# Patient Record
Sex: Male | Born: 1975 | Race: Black or African American | Hispanic: No | Marital: Single | Smoking: Current every day smoker
Health system: Southern US, Community
[De-identification: ages and names within clinical notes are randomized; demographics above are authoritative.]

## PROBLEM LIST (undated history)

## (undated) DIAGNOSIS — I10 Essential (primary) hypertension: Secondary | ICD-10-CM

## (undated) DIAGNOSIS — K279 Peptic ulcer, site unspecified, unspecified as acute or chronic, without hemorrhage or perforation: Secondary | ICD-10-CM

## (undated) HISTORY — PX: CORNEAL TRANSPLANT: SHX108

---

## 2005-01-05 ENCOUNTER — Inpatient Hospital Stay (HOSPITAL_COMMUNITY): Admission: EM | Admit: 2005-01-05 | Discharge: 2005-01-07 | Payer: Self-pay | Admitting: Emergency Medicine

## 2011-09-05 ENCOUNTER — Emergency Department (HOSPITAL_COMMUNITY)
Admission: EM | Admit: 2011-09-05 | Discharge: 2011-09-05 | Disposition: A | Discharge status: Home / Self Care | Payer: Self-pay | Attending: Emergency Medicine | Admitting: Emergency Medicine

## 2011-09-05 ENCOUNTER — Encounter (HOSPITAL_COMMUNITY): Payer: Self-pay | Admitting: *Deleted

## 2011-09-05 DIAGNOSIS — H5710 Ocular pain, unspecified eye: Secondary | ICD-10-CM | POA: Insufficient documentation

## 2011-09-05 DIAGNOSIS — F17200 Nicotine dependence, unspecified, uncomplicated: Secondary | ICD-10-CM | POA: Insufficient documentation

## 2011-09-05 MED ORDER — OXYCODONE-ACETAMINOPHEN 5-325 MG PO TABS
2.0000 | ORAL_TABLET | Freq: Once | ORAL | Status: AC
  Administered 2011-09-05: 2 via ORAL
  Filled 2011-09-05: qty 2

## 2011-09-05 MED ORDER — PROPARACAINE HCL 0.5 % OP SOLN
1.0000 [drp] | Freq: Once | OPHTHALMIC | Status: AC
  Administered 2011-09-05: 1 [drp] via OPHTHALMIC
  Filled 2011-09-05: qty 15

## 2011-09-05 MED ORDER — CIPROFLOXACIN HCL 0.3 % OP SOLN
1.0000 [drp] | Freq: Once | OPHTHALMIC | Status: AC
  Administered 2011-09-05: 1 [drp] via OPHTHALMIC
  Filled 2011-09-05: qty 2.5

## 2011-09-05 MED ORDER — FLUORESCEIN SODIUM 1 MG OP STRP
1.0000 | ORAL_STRIP | Freq: Once | OPHTHALMIC | Status: AC
  Administered 2011-09-05: 1 via OPHTHALMIC
  Filled 2011-09-05: qty 1

## 2011-09-05 MED ORDER — OXYCODONE-ACETAMINOPHEN 5-325 MG PO TABS: 1.0000 | ORAL_TABLET | ORAL | Status: AC | PRN

## 2011-09-05 NOTE — ED Notes (Signed)
The pt thinks he has an infection in his lt eye for 2 days.  He had a corneal transplant in 2010

## 2011-09-05 NOTE — ED Provider Notes (Signed)
History    36yM with L eye pain. Gradual onset 2d ago. Denies trauma. Initially thought had just had something in his eye, but persistently got worse. Photophobia. Tearing. Past hx significant for corneal transplant at [facility] in 2010. 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 It really didn't help my vision but they told me it probably wouldn't." Poor vision at baseline, but worse in L eye in past two days. No floaters or diplopia. Does not wear contacts. No welding or significant UV exposure.  No fever or chills.   CSN: [id number]  Arrival date & time 09/05/11  1501   First MD Initiated Contact with Patient 09/05/11 1516      Chief Complaint  Patient presents with  . Eye Pain    (Consider location/radiation/quality/duration/timing/severity/associated sxs/prior treatment) HPI  No past medical history on file.  Past Surgical History  Procedure Date  . Corneal transplant     No family history on file.  History  Substance Use Topics  . Smoking status: Current Everyday Smoker  . Smokeless tobacco: Not on file  . Alcohol Use: Yes      Review of Systems   Review of symptoms negative unless otherwise noted in HPI.   Allergies  Aspirin  Home Medications  No current outpatient prescriptions on file.  BP 140/90  Pulse 88  Temp 98.5 F (36.9 C) (Oral)  Resp 20  Physical Exam  Nursing note and vitals reviewed. Constitutional: He appears well-developed and well-nourished. No distress.  HENT:  Head: Normocephalic and atraumatic.  Eyes: Right eye exhibits no discharge. Left eye exhibits no discharge.       L conjunctiva injected. Tearing. Focal fluorescein uptake at ~ 12 o'clock position at junction of corneal transplant. Lids and lashed normal.   Neck: Neck supple.  Cardiovascular: Normal rate, regular rhythm and normal heart sounds.  Exam reveals no gallop and no friction rub.   No murmur heard. Pulmonary/Chest: Effort normal and breath sounds normal. No respiratory distress.    Abdominal: Soft.  Musculoskeletal: He exhibits no edema and no tenderness.  Neurological: He is alert.  Skin: Skin is warm and dry.  Psychiatric: He has a normal mood and affect. His behavior is normal. Thought content normal.    ED Course  Procedures (including critical care time)  Labs Reviewed - No data to display No results found.   1. Eye pain       MDM  36yM with atraumatic L eye pain. Focal uptake of fluorescein. Pt in need of very close optho fu. Abx and analgesia provided.         Raeford Razor, MD 09/08/11 2340

## 2011-09-09 ENCOUNTER — Encounter (HOSPITAL_COMMUNITY): Payer: Self-pay | Admitting: *Deleted

## 2011-09-09 ENCOUNTER — Emergency Department (HOSPITAL_COMMUNITY)
Admission: EM | Admit: 2011-09-09 | Discharge: 2011-09-10 | Disposition: A | Discharge status: Home / Self Care | Payer: Self-pay | Attending: Emergency Medicine | Admitting: Emergency Medicine

## 2011-09-09 DIAGNOSIS — H5710 Ocular pain, unspecified eye: Secondary | ICD-10-CM | POA: Insufficient documentation

## 2011-09-09 DIAGNOSIS — F17200 Nicotine dependence, unspecified, uncomplicated: Secondary | ICD-10-CM | POA: Insufficient documentation

## 2011-09-09 MED ORDER — OXYCODONE-ACETAMINOPHEN 5-325 MG PO TABS
2.0000 | ORAL_TABLET | Freq: Once | ORAL | Status: AC
  Administered 2011-09-09: 2 via ORAL
  Filled 2011-09-09: qty 2

## 2011-09-09 MED ORDER — PERCOCET 5-325 MG PO TABS: 1.0000 | ORAL_TABLET | ORAL | Status: AC | PRN

## 2011-09-09 MED ORDER — CIPROFLOXACIN HCL 0.3 % OP SOLN: 2.0000 [drp] | Freq: Four times a day (QID) | OPHTHALMIC | Status: AC

## 2011-09-09 MED ORDER — TETRACAINE HCL 0.5 % OP SOLN
2.0000 [drp] | Freq: Once | OPHTHALMIC | Status: AC
  Administered 2011-09-09: 2 [drp] via OPHTHALMIC
  Filled 2011-09-09: qty 2

## 2011-09-09 MED ORDER — CEPHALEXIN 500 MG PO CAPS: 500.0000 mg | ORAL_CAPSULE | Freq: Four times a day (QID) | ORAL | Status: AC

## 2011-09-09 NOTE — ED Provider Notes (Signed)
History     CSN: [id number]  Arrival date & time 09/09/11  1955   First MD Initiated Contact with Patient 09/09/11 2106      Chief Complaint  Patient presents with  . Eye Problem    (Consider location/radiation/quality/duration/timing/severity/associated sxs/prior treatment) HPI Comments: Patient with hx corneal transplant and current infection of same eye returns to ED today, last seen on 7/11, for continued left eye pain.  Pt states he was seen in ED, had testing done that he does not want done again, was using cipro drops and percocet at home with some improvement and wants a refill of these.  Has called ophthalmology but does not have appointment until 7 days from today.  Denies fevers.  Reports pain with moving eyes but no difficulty doing so.  Clear watery discharge. Denies trauma to the eye or any known foreign body.  Pain has been relieved with numbing drops in the ED and at home with percocet.  Per Dr Marylen Ponto notes patient has focal uptake of fluorescein on exam.  Pt states he does not know who his eye doctor is at [facility], states he has to go through "a bunch of papers" to find out.    Patient is a 36 y.o. male presenting with eye problem. The history is provided by the patient and medical records.  Eye Problem  Associated symptoms include discharge and eye redness.    History reviewed. No pertinent past medical history.  Past Surgical History  Procedure Date  . Corneal transplant     History reviewed. No pertinent family history.  History  Substance Use Topics  . Smoking status: Current Everyday Smoker  . Smokeless tobacco: Not on file  . Alcohol Use: Yes      Review of Systems  Constitutional: Negative for fever and chills.  Eyes: Positive for pain, discharge and redness. Negative for itching.    Allergies  Aspirin  Home Medications   Current Outpatient Rx  Name Route Sig Dispense Refill  . CIPROFLOXACIN HCL 0.3 % OP SOLN Left Eye Place 1 drop into the  left eye every 4 (four) hours.    . OXYCODONE-ACETAMINOPHEN 5-325 MG PO TABS Oral Take 1-2 tablets by mouth every 4 (four) hours as needed for pain. 15 tablet 0    BP 124/86  Pulse 66  Temp 97.2 F (36.2 C) (Oral)  Resp 18  SpO2 96%  Physical Exam  Constitutional: He is oriented to person, place, and time. He appears well-developed and well-nourished.  HENT:  Head: Normocephalic and atraumatic.  Eyes: EOM are normal. Left eye exhibits discharge. Left conjunctiva is injected. Left conjunctiva has no hemorrhage. No scleral icterus. Left eye exhibits normal extraocular motion and no nystagmus.  Neck: Neck supple.  Pulmonary/Chest: Effort normal.  Neurological: He is alert and oriented to person, place, and time. He exhibits normal muscle tone. Gait normal.    ED Course  Procedures (including critical care time)  Labs Reviewed - No data to display No results found.  11:47 PM Discussed patient with Dr Burgess Estelle who requests patient to call office at 8:30 tomorrow morning and will be seen the same day, should continue to antibiotics and pain medication.    Patient also seen and examined by Dr Jeraldine Loots who suggests adding oral antibiotic given preseptal edema.    1. Eye pain       MDM  Pt with hx corneal transplant at [facility] 2 years ago with 6 days of eye pain with injection, increased fluorescein  uptake at edge of transplant per Dr Marylen Ponto note 7/11.  Pt contacted ophthalmology but has delayed appointment.  Given patient's hx of corneal transplant I consulted the on call ophthalmologist for advise on treatment and to ensure closer follow up.  Dr Burgess Estelle agreed to see patient in his office tomorrow, will continue cipro and percocet, add keflex.  Return precautions given.  Patient verbalizes understanding and agrees with plan.          Dillard Cannon [place], [place] 09/10/11 [phone]

## 2011-09-09 NOTE — ED Notes (Signed)
Patient had a cornea transplant a few years ago and about two days ago he noticed that there was something wrong her his left.  Patient is having eye drainage and pain to the left eye

## 2011-09-10 NOTE — ED Notes (Signed)
Prescriptions given X3 with discharge instructions

## 2011-09-11 NOTE — ED Provider Notes (Signed)
Medical screening examination/treatment/procedure(s) were conducted as a shared visit with non-physician practitioner(s) and myself.  I personally evaluated the patient during the encounter On my exam this M w Hx of corneal transplant p/w concerns for infection.  He was in no distress.  We discussed the case with optho, and the patient was d/c w PO ABX and AM F/U.  Gerhard Munch, MD 09/11/11 [phone]

## 2012-06-04 ENCOUNTER — Emergency Department (INDEPENDENT_AMBULATORY_CARE_PROVIDER_SITE_OTHER)
Admission: EM | Admit: 2012-06-04 | Discharge: 2012-06-04 | Disposition: A | Discharge status: Home / Self Care | Payer: Self-pay | Source: Home / Self Care | Attending: Emergency Medicine | Admitting: Emergency Medicine

## 2012-06-04 ENCOUNTER — Encounter (HOSPITAL_COMMUNITY): Payer: Self-pay | Admitting: Emergency Medicine

## 2012-06-04 DIAGNOSIS — H5712 Ocular pain, left eye: Secondary | ICD-10-CM

## 2012-06-04 DIAGNOSIS — H5710 Ocular pain, unspecified eye: Secondary | ICD-10-CM

## 2012-06-04 HISTORY — DX: Essential (primary) hypertension: I10

## 2012-06-04 MED ORDER — CIPROFLOXACIN HCL 0.3 % OP SOLN: 1.0000 [drp] | OPHTHALMIC | Status: DC

## 2012-06-04 MED ORDER — HYDROCODONE-ACETAMINOPHEN 5-325 MG PO TABS: ORAL_TABLET | ORAL | Status: DC

## 2012-06-04 MED ORDER — HYDROCODONE-ACETAMINOPHEN 5-325 MG PO TABS
2.0000 | ORAL_TABLET | Freq: Once | ORAL | Status: AC
  Administered 2012-06-04: 2 via ORAL

## 2012-06-04 MED ORDER — HYDROCODONE-ACETAMINOPHEN 5-325 MG PO TABS
ORAL_TABLET | ORAL | Status: AC
  Filled 2012-06-04: qty 2

## 2012-06-04 MED ORDER — TETRACAINE HCL 0.5 % OP SOLN
OPHTHALMIC | Status: AC
  Filled 2012-06-04: qty 2

## 2012-06-04 NOTE — ED Notes (Signed)
Pt reports drainage out of both sides of left eye and is unable to see out of left eye and is burning and painful. Extremely sensitive to light and is having headaches. Pt had cornea transplant 2 1./2 years ago in left eye - did not get stiches removed until 6 months ago. Can only see shadows out of right (requires surgery but does not have finances at this time) referred to [facility]. Pt is seen at [facility] for regular eye tx.

## 2012-06-04 NOTE — ED Provider Notes (Signed)
Chief Complaint:   Chief Complaint  Patient presents with  . 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 He had a left corneal transplant 2-3 years ago at [facility]. There were some sutures that extruded about 6 months ago. He sees a Dr. Les Pou there. For the past 2 days he's had burning of the left eye, pain, tearing, and blurring of vision. He can't see much of his right eye at all. There is no purulent drainage, fever, nasal congestion, sore throat, or cough. The patient states he does not want to go back to [facility] for a variety of reasons. He cites transportation problems, caused, and the fact that they never do anything there but give him antibiotics and steroid drops when he presents with such complaints. He has had similar episodes to this in the past.  Review of Systems:  Other than noted above, the patient denies any of the following symptoms: Systemic:  No fever, chills, sweats, fatigue, or weight loss. Eye:  No redness, eye pain, photophobia, discharge, blurred vision, or diplopia. ENT:  No nasal congestion, rhinorrhea, or sore throat. Lymphatic:  No adenopathy. Skin:  No rash or pruritis.  PMFSH:  Past medical history, family history, social history, meds, and allergies were reviewed.   Physical Exam:   Vital signs:  BP 149/99  Pulse 111  Temp(Src) 98.5 F (36.9 C) (Oral)  Resp 18  SpO2 99% General:  Alert and in moderate distress due to his eye problem. Eye:  The cornea of the right eye is almost completely opaque and the pupil and fundus could not be seen. There was tearing and conjunctival injection of both eyes. The left eye has a corneal transplant. It appears to be normal. There is no purulent drainage. PERRLA, full EOMs, fundi were benign. ENT:  TMs and canals clear.  Nasal mucosa normal.  No intra-oral lesions, mucous membranes moist, pharynx clear. Neck:  No adenopathy tenderness or mass. Skin:  Clear,  warm and dry.  Assessment:  The encounter diagnosis was Eye pain, left.  I'm not sure was causing the eye pain. He needs to see an ophthalmologist. He did not want to go to [facility] to be seen there. Our ophthalmologist on call today was called and he agreed to see him in his office.  Plan:   1.  The following meds were prescribed:   Discharge Medication List as of 06/04/2012 11:53 AM    START taking these medications   Details  !! ciprofloxacin (CILOXAN) 0.3 % ophthalmic solution Place 1 drop into the left eye every 2 (two) hours. Administer 1 drop, every 2 hours, while awake, for 2 days. Then 1 drop, every 4 hours, while awake, for the next 5 days., Starting 06/04/2012, Until Discontinued, Normal    HYDROcodone-acetaminophen (NORCO/VICODIN) 5-325 MG per tablet 1 to 2 tabs every 4 to 6 hours as needed for pain., Print     !! - Potential duplicate medications found. Please discuss with provider.     2.  The patient was instructed in symptomatic care and handouts were given. 3.  The patient was told to return if becoming worse in any way, if no better in 3 or 4 days, and given some red flag symptoms such as changes in his vision, worsening pain, or fever that would indicate earlier return.     Reuben Likes, MD 06/04/12 2200

## 2013-04-09 ENCOUNTER — Emergency Department (HOSPITAL_COMMUNITY)
Admission: EM | Admit: 2013-04-09 | Discharge: 2013-04-09 | Disposition: A | Discharge status: Home / Self Care | Payer: Self-pay | Attending: Emergency Medicine | Admitting: Emergency Medicine

## 2013-04-09 ENCOUNTER — Encounter (HOSPITAL_COMMUNITY): Payer: Self-pay | Admitting: Emergency Medicine

## 2013-04-09 DIAGNOSIS — I10 Essential (primary) hypertension: Secondary | ICD-10-CM | POA: Insufficient documentation

## 2013-04-09 DIAGNOSIS — F17200 Nicotine dependence, unspecified, uncomplicated: Secondary | ICD-10-CM | POA: Insufficient documentation

## 2013-04-09 DIAGNOSIS — H5710 Ocular pain, unspecified eye: Secondary | ICD-10-CM | POA: Insufficient documentation

## 2013-04-09 DIAGNOSIS — Z792 Long term (current) use of antibiotics: Secondary | ICD-10-CM | POA: Insufficient documentation

## 2013-04-09 DIAGNOSIS — Z760 Encounter for issue of repeat prescription: Secondary | ICD-10-CM | POA: Insufficient documentation

## 2013-04-09 DIAGNOSIS — H53149 Visual discomfort, unspecified: Secondary | ICD-10-CM | POA: Insufficient documentation

## 2013-04-09 MED ORDER — OXYCODONE-ACETAMINOPHEN 5-325 MG PO TABS: 1.0000 | ORAL_TABLET | Freq: Four times a day (QID) | ORAL | Status: DC | PRN

## 2013-04-09 MED ORDER — OXYCODONE-ACETAMINOPHEN 5-325 MG PO TABS
2.0000 | ORAL_TABLET | Freq: Once | ORAL | Status: AC
  Administered 2013-04-09: 2 via ORAL
  Filled 2013-04-09: qty 2

## 2013-04-09 NOTE — ED Notes (Signed)
Pt in stating he is scheduled to have a corneal transplant to his right on on 2/24 and he is out of his oxycodone that he takes for pain to that eye, pt is here requesting a refill of that medication. Denies changes to his pain in that eye.

## 2013-04-09 NOTE — ED Provider Notes (Signed)
CSN: [id number]     Arrival date & time 04/09/13  1720 History  This chart was scribed for non-physician practitioner Rhea BleacherJosh Mamye Bolds, PA-C working with Layla MawKristen N Ward, DO by Joaquin MusicKristina Sanchez-Matthews, ED Scribe. This patient was seen in room TR04C/TR04C and the patient's care was started at 5:38 PM .    Chief Complaint  Patient presents with  . Eye Pain  . Medication Refill   The history is provided by the patient. No language interpreter was used.   HPI Comments: Corey Haynes Basic is a 38 y.o. male with a hx of keratoconus who presents to the Emergency Department complaining of constant persistent L eye pain with photophobia that began 2 months ago. Pt states he is scheduled for a L cornea [facility]. He states he has been taking Oxycodone due to pain which is being prescribed by Dr. Toney Sangelovoya. Pt states he is photophobic to the point he is unable to tolerate the light from the TV while changing the channels. Pt states his sister gave him a hydrocodone she had and denies having relief. Pt denies fever.  Past Medical History  Diagnosis Date  . Hypertension    Past Surgical History  Procedure Laterality Date  . Corneal transplant     History reviewed. No pertinent family history. History  Substance Use Topics  . Smoking status: Current Every Day Smoker -- 1.00 packs/day    Types: Cigarettes  . Smokeless tobacco: Not on file  . Alcohol Use: Yes    Review of Systems  Constitutional: Negative for fever.  Eyes: Positive for photophobia, pain and visual disturbance. Negative for discharge, redness and itching.   Allergies  Aspirin  Home Medications   Current Outpatient Rx  Name  Route  Sig  Dispense  Refill  . ciprofloxacin (CILOXAN) 0.3 % ophthalmic solution   Left Eye   Place 1 drop into the left eye every 4 (four) hours.         . ciprofloxacin (CILOXAN) 0.3 % ophthalmic solution   Left Eye   Place 1 drop into the left eye every 2 (two)  hours. Administer 1 drop, every 2 hours, while awake, for 2 days. Then 1 drop, every 4 hours, while awake, for the next 5 days.   5 mL   0   . HYDROcodone-acetaminophen (NORCO/VICODIN) 5-325 MG per tablet      1 to 2 tabs every 4 to 6 hours as needed for pain.   20 tablet   0    BP 136/93  Pulse 116  Temp(Src) 98 F (36.7 C) (Oral)  Resp 18  SpO2 98%  Physical Exam  Nursing note and vitals reviewed. Constitutional: He appears well-developed and well-nourished. No distress.  HENT:  Head: Normocephalic and atraumatic.  Eyes: Conjunctivae and EOM are normal. Right eye exhibits no discharge. Left eye exhibits no discharge.  R cornea is hazy. R sclera is normal. No signs of corneal hydrops. L eye s/p corneal transplant.   Neck: Neck supple. No tracheal deviation present.  Cardiovascular: Normal rate.   Pulmonary/Chest: Effort normal. No respiratory distress.  Musculoskeletal: Normal range of motion.  Neurological: He is alert.  Skin: Skin is warm and dry.  Psychiatric: He has a normal mood and affect. His behavior is normal.   ED Course  Procedures  DIAGNOSTIC STUDIES: Oxygen Saturation is 98% on RA, normal by my interpretation.    COORDINATION OF CARE: 5:43 PM-Discussed treatment plan which includes administer pain medication while  in ED and discharge pt with pain medications. Pt agreed to plan.   Labs Review Labs Reviewed - No data to display Imaging Review No results found.  EKG Interpretation   None      Vital signs reviewed and are as follows: Filed Vitals:   04/09/13 1724  BP: 136/93  Pulse: 116  Temp: 98 F (36.7 C)  Resp: 18   Patient counseled on use of narcotic pain medications. Counseled not to combine these medications with others containing tylenol. Urged not to drink alcohol, drive, or perform any other activities that requires focus while taking these medications. The patient verbalizes understanding and agrees with the plan.   MDM   Final  diagnoses:  Eye pain   Patient here with main complaint of pain control. Pain has been similar for the past 2 months. He is scheduled for corneal transplant. Pain is not acutely worse, he is just out of his pain medication. Vision is abnormal and unchanged. There does not appear to be any new complications. Patient has followup at [facility].  I personally performed the services described in this documentation, which was scribed in my presence. The recorded information has been reviewed and is accurate.    Renne Crigler, PA-C 04/09/13 2003

## 2013-04-09 NOTE — Discharge Instructions (Signed)
Please read and follow all provided instructions.  Your diagnoses today include:  1. Eye pain     Tests performed today include:  Vital signs. See below for your results today.   Medications prescribed:   Percocet (oxycodone/acetaminophen) - narcotic pain medication  DO NOT drive or perform any activities that require you to be awake and alert because this medicine can make you drowsy. BE VERY CAREFUL not to take multiple medicines containing Tylenol (also called acetaminophen). Doing so can lead to an overdose which can damage your liver and cause liver failure and possibly death.  Home care instructions:  Follow any educational materials contained in this packet.  Follow-up instructions: Please follow-up with your ophthalmologist as planned for further evaluation of your symptoms.  If you do not have a primary care doctor -- see below for referral information.   Return instructions:   Please return to the Emergency Department if you experience worsening symptoms.   Please return if you have any other emergent concerns.  Additional Information:  Your vital signs today were: BP 136/93   Pulse 116   Temp(Src) 98 F (36.7 C) (Oral)   Resp 18   SpO2 98% If your blood pressure (BP) was elevated above 135/85 this visit, please have this repeated by your doctor within one month. ---------------

## 2013-04-10 NOTE — ED Provider Notes (Signed)
Medical screening examination/treatment/procedure(s) were performed by non-physician practitioner and as supervising physician I was immediately available for consultation/collaboration.  EKG Interpretation   None         Kristen N Ward, DO 04/10/13 0025 

## 2013-08-30 ENCOUNTER — Encounter (HOSPITAL_COMMUNITY): Payer: Self-pay | Admitting: Emergency Medicine

## 2013-08-30 ENCOUNTER — Emergency Department (HOSPITAL_COMMUNITY)
Admission: EM | Admit: 2013-08-30 | Discharge: 2013-08-30 | Disposition: A | Discharge status: Home / Self Care | Payer: Self-pay | Attending: Emergency Medicine | Admitting: Emergency Medicine

## 2013-08-30 DIAGNOSIS — Z947 Corneal transplant status: Secondary | ICD-10-CM | POA: Insufficient documentation

## 2013-08-30 DIAGNOSIS — F17200 Nicotine dependence, unspecified, uncomplicated: Secondary | ICD-10-CM | POA: Insufficient documentation

## 2013-08-30 DIAGNOSIS — H53149 Visual discomfort, unspecified: Secondary | ICD-10-CM | POA: Insufficient documentation

## 2013-08-30 DIAGNOSIS — R51 Headache: Secondary | ICD-10-CM | POA: Insufficient documentation

## 2013-08-30 DIAGNOSIS — H16201 Unspecified keratoconjunctivitis, right eye: Secondary | ICD-10-CM

## 2013-08-30 DIAGNOSIS — I10 Essential (primary) hypertension: Secondary | ICD-10-CM | POA: Insufficient documentation

## 2013-08-30 DIAGNOSIS — H16209 Unspecified keratoconjunctivitis, unspecified eye: Secondary | ICD-10-CM | POA: Insufficient documentation

## 2013-08-30 DIAGNOSIS — H5711 Ocular pain, right eye: Secondary | ICD-10-CM

## 2013-08-30 MED ORDER — OXYCODONE-ACETAMINOPHEN 5-325 MG PO TABS
2.0000 | ORAL_TABLET | Freq: Once | ORAL | Status: AC
  Administered 2013-08-30: 2 via ORAL
  Filled 2013-08-30: qty 2

## 2013-08-30 MED ORDER — HYDROCODONE-ACETAMINOPHEN 5-325 MG PO TABS: 1.0000 | ORAL_TABLET | Freq: Four times a day (QID) | ORAL | Status: DC | PRN

## 2013-08-30 NOTE — ED Notes (Signed)
The pt has had pain in his rt eye for 2 days.  He needs a corneal transplant in the future

## 2013-08-30 NOTE — Discharge Instructions (Signed)
We saw you in the ER for the eye pain. The right eye has hazy cornea and like your left eye, will need formal opthalmology evaluation for possible surgery/transplant. [facility] as soon as you can.

## 2013-09-01 NOTE — ED Provider Notes (Signed)
CSN: [id number]     Arrival date & time 08/30/13  0430 History   First MD Initiated Contact with Patient 08/30/13 0518     Chief Complaint  Patient presents with  . Eye Problem     (Consider location/radiation/quality/duration/timing/severity/associated sxs/prior Treatment) HPI Comments: Pt with corneal disease of the eye, s/p transplant to the left eye comes in with eye pain x 2 days on the right eye. Pt has had similar pain intermittently, but the current pain is constant. Described as sharp, throbbing pain - with a headache and photophobia. Pt has been advised that he will need transplant in the right eye as well.   Patient is a 38 y.o. male presenting with eye problem. The history is provided by the patient.  Eye Problem Associated symptoms: headaches, photophobia and redness   Associated symptoms: no itching     Past Medical History  Diagnosis Date  . Hypertension    Past Surgical History  Procedure Laterality Date  . Corneal transplant     No family history on file. History  Substance Use Topics  . Smoking status: Current Every Day Smoker -- 1.00 packs/day    Types: Cigarettes  . Smokeless tobacco: Not on file  . Alcohol Use: Yes    Review of Systems  Constitutional: Negative for activity change.  Eyes: Positive for photophobia, pain and redness. Negative for itching and visual disturbance.  Neurological: Positive for headaches.      Allergies  Aspirin and Shellfish allergy  Home Medications   Prior to Admission medications   Medication Sig Start Date End Date Taking? Authorizing Provider  HYDROcodone-acetaminophen (NORCO/VICODIN) 5-325 MG per tablet Take 1 tablet by mouth every 6 (six) hours as needed. 08/30/13   Keyna Blizard, MD   BP 139/101  Pulse 87  Temp(Src) 98.1 F (36.7 C) (Oral)  Resp 17  SpO2 97% Physical Exam  Nursing note and vitals reviewed. Constitutional: He appears well-developed.  HENT:  Head: Atraumatic.  Eyes:  Right  eye: Hazy/cloudy cornea. EOMI + photophobia, including consensual photophobia Eye pressure per tonopen - 18 and 19.    Neck: Neck supple.  Pulmonary/Chest: Effort normal.  Neurological: He is alert.  Skin: Skin is warm.    ED Course  Procedures (including critical care time) Labs Review Labs Reviewed - No data to display  Imaging Review No results found.   EKG Interpretation None      MDM   Final diagnoses:  Eye pain, right  Keratoconjunctivitis, right    Pt with right eye pain x 2 days. Has known corneal disease, and baseline poor vision to that eye, and patient denies any worsening of vision acutely. Based on that - vascular etiologies/ ischemia related etiologies less likely. Eye pressures are WNL, and the wood's lamp exam is also normal. Will give some oral meds. Pt given [place] f/u  - but i highly advised him to cal [facility] immediately for appt, or go to [facility] if symptoms get worse.   Derwood KaplanAnkit Lynetta Tomczak, MD 09/01/13 [phone]

## 2013-12-31 ENCOUNTER — Encounter (HOSPITAL_COMMUNITY): Payer: Self-pay | Admitting: Emergency Medicine

## 2013-12-31 ENCOUNTER — Emergency Department (HOSPITAL_COMMUNITY)
Admission: EM | Admit: 2013-12-31 | Discharge: 2013-12-31 | Disposition: A | Discharge status: Home / Self Care | Payer: BC Managed Care – PPO | Attending: Emergency Medicine | Admitting: Emergency Medicine

## 2013-12-31 DIAGNOSIS — R519 Headache, unspecified: Secondary | ICD-10-CM

## 2013-12-31 DIAGNOSIS — Z720 Tobacco use: Secondary | ICD-10-CM | POA: Insufficient documentation

## 2013-12-31 DIAGNOSIS — R51 Headache: Secondary | ICD-10-CM | POA: Diagnosis present

## 2013-12-31 DIAGNOSIS — Z79899 Other long term (current) drug therapy: Secondary | ICD-10-CM | POA: Insufficient documentation

## 2013-12-31 DIAGNOSIS — I10 Essential (primary) hypertension: Secondary | ICD-10-CM | POA: Diagnosis not present

## 2013-12-31 DIAGNOSIS — H18891 Other specified disorders of cornea, right eye: Secondary | ICD-10-CM | POA: Insufficient documentation

## 2013-12-31 MED ORDER — OXYCODONE-ACETAMINOPHEN 5-325 MG PO TABS: 1.0000 | ORAL_TABLET | Freq: Four times a day (QID) | ORAL | Status: DC | PRN

## 2013-12-31 MED ORDER — OXYCODONE-ACETAMINOPHEN 5-325 MG PO TABS
2.0000 | ORAL_TABLET | Freq: Once | ORAL | Status: AC
  Administered 2013-12-31: 2 via ORAL
  Filled 2013-12-31: qty 2

## 2013-12-31 NOTE — ED Notes (Signed)
Pt. reports headache onset 2 days ago , denies injury , no nausea or vomitting , no fever or chills.

## 2013-12-31 NOTE — ED Provider Notes (Signed)
CSN: [id number]     Arrival date & time 12/31/13  [id number] History   First MD Initiated Contact with Patient 12/31/13 0604     Chief Complaint  Patient presents with  . Headache     (Consider location/radiation/quality/duration/timing/severity/associated sxs/prior Treatment) HPI Comments: Patient with a history of Keratoconus s/p left corneal transplant presents today with a right frontal headache.  He reports that the headache has been constant for the past 2 days.  Headache gradual in onset.  He states that this headache is identical to headaches that he has had in the past.  Patient has been seen in the ED previously for similar presentation.  He reports that he is not having pain of the right eye itself, but feels that the headache is due to the Keratoconus.  He reports that he is scheduled to have a right corneal transplant in the near future.  He reports that he has taken OTC pain medication without relief.  Her reports that Oxycodone has helped his pain in the past.  He does report associated photophobia.  He denies fever, chills, neck stiffness/pain, nausea, vomiting, dizziness, lightheadedness, or vision changes.    The history is provided by the patient.    Past Medical History  Diagnosis Date  . Hypertension    Past Surgical History  Procedure Laterality Date  . Corneal transplant     No family history on file. History  Substance Use Topics  . Smoking status: Current Every Day Smoker -- 1.00 packs/day    Types: Cigarettes  . Smokeless tobacco: Not on file  . Alcohol Use: Yes    Review of Systems  All other systems reviewed and are negative.     Allergies  Aspirin and Shellfish allergy  Home Medications   Prior to Admission medications   Medication Sig Start Date End Date Taking? Authorizing Provider  HYDROcodone-acetaminophen (NORCO/VICODIN) 5-325 MG per tablet Take 1 tablet by mouth every 6 (six) hours as needed. 08/30/13   Ankit Nanavati, MD   BP 124/91 mmHg   Pulse 75  Temp(Src) 98.1 F (36.7 C) (Oral)  Resp 26  SpO2 95% Physical Exam  Constitutional: He appears well-developed and well-nourished. No distress.  HENT:  Head: Normocephalic and atraumatic.  Mouth/Throat: Oropharynx is clear and moist.  Eyes: EOM are normal. Right conjunctiva is not injected. Right conjunctiva has no hemorrhage. Left conjunctiva is not injected. Left conjunctiva has no hemorrhage.  Right cornea hazy Left cornea s/p transplant  Neck: Normal range of motion. Neck supple.  Cardiovascular: Normal rate, regular rhythm and normal heart sounds.   Pulmonary/Chest: Effort normal and breath sounds normal.  Musculoskeletal: Normal range of motion.  Neurological: He is alert. He has normal strength. No cranial nerve deficit or sensory deficit. Coordination and gait normal.  Skin: Skin is warm and dry. He is not diaphoretic.  Psychiatric: He has a normal mood and affect.  Nursing note and vitals reviewed.   ED Course  Procedures (including critical care time) Labs Review Labs Reviewed - No data to display  Imaging Review No results found.   EKG Interpretation None     7:05 AM Reassessed patient.  He reports that his headache has improved at this time. MDM   Final diagnoses:  None   Patient with a history of Keratoconus s/p left corneal transplant presents today with a right sided headache.  Headache gradual in onset.  He has a normal neurological exam.  He is afebrile.  Headache completely resolved after he was  given Percocet.  Therefore, do not feel that any imaging is indicated at this time.  Feel that the patient is stable for discharge.  Return precautions given.    Santiago GladHeather Evanna Washinton, PA-C 01/02/14 [id number]  Loren Raceravid Yelverton, MD 01/03/14 [phone]

## 2013-12-31 NOTE — ED Notes (Signed)
Pt refusing to have IV started at this time.

## 2013-12-31 NOTE — ED Notes (Signed)
Pt stated that he has had a headache for 2 days that has been gradually getting worse.

## 2014-05-14 ENCOUNTER — Emergency Department (HOSPITAL_COMMUNITY)
Admission: EM | Admit: 2014-05-14 | Discharge: 2014-05-14 | Disposition: A | Discharge status: Home / Self Care | Payer: Self-pay | Attending: Emergency Medicine | Admitting: Emergency Medicine

## 2014-05-14 ENCOUNTER — Encounter (HOSPITAL_COMMUNITY): Payer: Self-pay | Admitting: *Deleted

## 2014-05-14 DIAGNOSIS — R51 Headache: Secondary | ICD-10-CM | POA: Insufficient documentation

## 2014-05-14 DIAGNOSIS — H5711 Ocular pain, right eye: Secondary | ICD-10-CM | POA: Insufficient documentation

## 2014-05-14 DIAGNOSIS — G8929 Other chronic pain: Secondary | ICD-10-CM | POA: Insufficient documentation

## 2014-05-14 DIAGNOSIS — Z720 Tobacco use: Secondary | ICD-10-CM | POA: Insufficient documentation

## 2014-05-14 DIAGNOSIS — I10 Essential (primary) hypertension: Secondary | ICD-10-CM | POA: Insufficient documentation

## 2014-05-14 DIAGNOSIS — R0981 Nasal congestion: Secondary | ICD-10-CM | POA: Insufficient documentation

## 2014-05-14 MED ORDER — OXYCODONE-ACETAMINOPHEN 5-325 MG PO TABS
1.0000 | ORAL_TABLET | Freq: Once | ORAL | Status: AC
Start: 2014-05-14 — End: 2014-05-14
  Administered 2014-05-14: 1 via ORAL
  Filled 2014-05-14: qty 1

## 2014-05-14 MED ORDER — OXYCODONE-ACETAMINOPHEN 5-325 MG PO TABS: 1.0000 | ORAL_TABLET | ORAL | Status: DC | PRN

## 2014-05-14 NOTE — ED Provider Notes (Signed)
CSN: [id number]     Arrival date & time 05/14/14  1345 History  This chart was scribed for non-physician practitioner, Trixie Dredge, PA-C working with Gwyneth Sprout, MD by Angelene Giovanni, ED Scribe. The patient was seen in room TR04C/TR04C and the patient's care was started at 2:45 PM    Chief Complaint  Patient presents with  . Eye Pain   The history is provided by the patient. No language interpreter was used.   HPI Comments: Corey Haynes is a 39 y.o. male with a hx of left eye corneal transplant and HTN who presents to the Emergency Department complaining of an intermittent throbbing right eye pain onset a couple of months ago. He explains that in the past 3 weeks, his throbbing in the back of his eye has been more consistent. He reports associated photophobia.  States this is exactly like his chronic eye pain without any changes or concerning features.  He also reports congestion and rhinorrhea but attributes that to allergies which he takes Claritin. He denies any eye discharge, fever, chills, abdominal pain, and N/V/D. He reports that he had an appointment for a left eye corneal transplant but he missed his appointment while he was incarcerated. His surgery was scheduled at [facility] to be performed by Dr. Selena Batten. He reports that he is able to see out of his left eye but only see light and shapes out of his right. He denies any recent injury to his eye.   No PCP or Optometrist   Past Medical History  Diagnosis Date  . Hypertension    Past Surgical History  Procedure Laterality Date  . Corneal transplant     History reviewed. No pertinent family history. History  Substance Use Topics  . Smoking status: Current Every Day Smoker -- 1.00 packs/day    Types: Cigarettes  . Smokeless tobacco: Not on file  . Alcohol Use: Yes    Review of Systems  Constitutional: Negative for fever and chills.  HENT: Positive for congestion, rhinorrhea and sneezing. Negative for facial swelling and sore  throat.   Eyes: Positive for photophobia, pain, discharge and visual disturbance. Negative for redness and itching.  Respiratory: Negative for cough and shortness of breath.   Musculoskeletal: Negative for neck pain.  Skin: Negative for color change and wound.  Allergic/Immunologic: Negative for immunocompromised state.  Neurological: Positive for headaches.  Psychiatric/Behavioral: Negative for self-injury.      Allergies  Aspirin and Shellfish allergy  Home Medications   Prior to Admission medications   Medication Sig Start Date End Date Taking? Authorizing Provider  HYDROcodone-acetaminophen (NORCO/VICODIN) 5-325 MG per tablet Take 1 tablet by mouth every 6 (six) hours as needed. 08/30/13   Derwood Kaplan, MD  oxyCODONE-acetaminophen (PERCOCET/ROXICET) 5-325 MG per tablet Take 1-2 tablets by mouth every 6 (six) hours as needed for severe pain. 12/31/13   Heather Laisure, PA-C   BP 136/99 mmHg  Pulse 86  Temp(Src) 98.5 F (36.9 C) (Oral)  Resp 14  SpO2 98% Physical Exam  Constitutional: He appears well-developed and well-nourished. No distress.  HENT:  Head: Normocephalic and atraumatic.  Eyes: EOM are normal.  Right eye: Diffuse haze through central cornea full EOM's intact.  Vision in right eye consists of detecting motion and light.  + photophobia   Neck: Normal range of motion. Neck supple.  Pulmonary/Chest: Effort normal.  Neurological: He is alert.  Skin: He is not diaphoretic.  Nursing note and vitals reviewed.   ED Course  Procedures (including critical care  time) DIAGNOSTIC STUDIES: Oxygen Saturation is 98% on RA, normal by my interpretation.    COORDINATION OF CARE: 2:45 PM- Pt advised of plan for treatment and pt agrees.    Labs Review Labs Reviewed - No data to display  Imaging Review No results found.   EKG Interpretation None      MDM   Final diagnoses:  Chronic pain  Pain, eye, right    Afebrile, nontoxic patient with hx keratoconus  and hx left corneal transplant, p/w chronic right eye pain, released from jail 5 days ago, requesting pain medication.  No injury or changes from his chronic pain.  I have explained to patient our upcoming group policy of not treating chronic pain with narcotic prescriptions from the ED, will discharge with 10 percocet, pt will need primary care to provide pain management until [facility].   D/C home with #10 percocet as above, [facility], Ophthalmology follow up.  Discussed result, findings, treatment, and follow up  with patient.  Pt given return precautions.  Pt verbalizes understanding and agrees with plan.       I personally performed the services described in this documentation, which was scribed in my presence. The recorded information has been reviewed and is accurate.   Trixie Dredgemily Jeancarlo Leffler, PA-C 05/14/14 1512  Gwyneth SproutWhitney Plunkett, MD 05/15/14 [phone]

## 2014-05-14 NOTE — ED Notes (Signed)
Pt has eye condition that requires corneal transplant. Had one done to left eye in past and was suppose to have one to left eye recently but pt had to cancel. Is in the process of getting surgery rescheduled but is now having throbbing pain to right eye and requesting medication. No acute disrss noted at triage.

## 2014-05-14 NOTE — Discharge Instructions (Signed)
Read the information below.  Use the prescribed medication as directed.  Please discuss all new medications with your pharmacist.  Do not take additional tylenol while taking the prescribed pain medication to avoid overdose.  You may return to the Emergency Department at any time for worsening condition or any new symptoms that concern you.   If you develop worsening pain in your eye, change in your vision, swelling around your eye, difficulty moving your eye, or fevers greater than 100.4, see your eye doctor or return to the Emergency Department immediately for a recheck.      Chronic Pain Chronic pain can be defined as pain that is off and on and lasts for 3-6 months or longer. Many things cause chronic pain, which can make it difficult to make a diagnosis. There are many treatment options available for chronic pain. However, finding a treatment that works well for you may require trying various approaches until the right one is found. Many people benefit from a combination of two or more types of treatment to control their pain. SYMPTOMS  Chronic pain can occur anywhere in the body and can range from mild to very severe. Some types of chronic pain include:  Headache.  Low back pain.  Cancer pain.  Arthritis pain.  Neurogenic pain. This is pain resulting from damage to nerves. People with chronic pain may also have other symptoms such as:  Depression.  Anger.  Insomnia.  Anxiety. DIAGNOSIS  Your health care provider will help diagnose your condition over time. In many cases, the initial focus will be on excluding possible conditions that could be causing the pain. Depending on your symptoms, your health care provider may order tests to diagnose your condition. Some of these tests may include:   Blood tests.   CT scan.   MRI.   X-rays.   Ultrasounds.   Nerve conduction studies.  You may need to see a specialist.  TREATMENT  Finding treatment that works well may  take time. You may be referred to a pain specialist. He or she may prescribe medicine or therapies, such as:   Mindful meditation or yoga.  Shots (injections) of numbing or pain-relieving medicines into the spine or area of pain.  Local electrical stimulation.  Acupuncture.   Massage therapy.   Aroma, color, light, or sound therapy.   Biofeedback.   Working with a physical therapist to keep from getting stiff.   Regular, gentle exercise.   Cognitive or behavioral therapy.   Group support.  Sometimes, surgery may be recommended.  HOME CARE INSTRUCTIONS   Take all medicines as directed by your health care provider.   Lessen stress in your life by relaxing and doing things such as listening to calming music.   Exercise or be active as directed by your health care provider.   Eat a healthy diet and include things such as vegetables, fruits, fish, and lean meats in your diet.   Keep all follow-up appointments with your health care provider.   Attend a support group with others suffering from chronic pain. SEEK MEDICAL CARE IF:   Your pain gets worse.   You develop a new pain that was not there before.   You cannot tolerate medicines given to you by your health care provider.   You have new symptoms since your last visit with your health care provider.  SEEK IMMEDIATE MEDICAL CARE IF:   You feel weak.   You have decreased sensation or numbness.   You lose  control of bowel or bladder function.   Your pain suddenly gets much worse.   You develop shaking.  You develop chills.  You develop confusion.  You develop chest pain.  You develop shortness of breath.  MAKE SURE YOU:  Understand these instructions.  Will watch your condition.  Will get help right away if you are not doing well or get worse. Document Released: 11/03/2001 Document Revised: 10/14/2012 Document Reviewed: 08/07/2012 [facility] Patient Information 2015 [employer]  [place]. This information is not intended to replace advice given to you by your health care provider. Make sure you discuss any questions you have with your health care provider.

## 2014-05-14 NOTE — ED Notes (Signed)
Onset several days right eye pain.  Missed right corneal eye transplant d/t being incarcerated and just being released.  He has to set up surgery again.  Pt here for pain control only.

## 2014-06-29 ENCOUNTER — Encounter (HOSPITAL_COMMUNITY): Payer: Self-pay | Admitting: Emergency Medicine

## 2014-06-29 ENCOUNTER — Emergency Department (HOSPITAL_COMMUNITY)
Admission: EM | Admit: 2014-06-29 | Discharge: 2014-06-29 | Disposition: A | Discharge status: Home / Self Care | Payer: Self-pay | Attending: Emergency Medicine | Admitting: Emergency Medicine

## 2014-06-29 DIAGNOSIS — Z720 Tobacco use: Secondary | ICD-10-CM | POA: Insufficient documentation

## 2014-06-29 DIAGNOSIS — I10 Essential (primary) hypertension: Secondary | ICD-10-CM | POA: Insufficient documentation

## 2014-06-29 DIAGNOSIS — H53141 Visual discomfort, right eye: Secondary | ICD-10-CM | POA: Insufficient documentation

## 2014-06-29 DIAGNOSIS — H5711 Ocular pain, right eye: Secondary | ICD-10-CM | POA: Insufficient documentation

## 2014-06-29 MED ORDER — TETRACAINE HCL 0.5 % OP SOLN
2.0000 [drp] | Freq: Once | OPHTHALMIC | Status: DC
  Filled 2014-06-29: qty 2

## 2014-06-29 MED ORDER — FLUORESCEIN SODIUM 1 MG OP STRP
1.0000 | ORAL_STRIP | Freq: Once | OPHTHALMIC | Status: DC
  Filled 2014-06-29: qty 1

## 2014-06-29 MED ORDER — OXYCODONE-ACETAMINOPHEN 5-325 MG PO TABS
2.0000 | ORAL_TABLET | Freq: Once | ORAL | Status: AC
  Administered 2014-06-29: 2 via ORAL
  Filled 2014-06-29: qty 2

## 2014-06-29 NOTE — ED Notes (Signed)
Patient is alert and orientedx4.  Patient was explained discharge instructions and they understood them with no questions.   

## 2014-06-29 NOTE — ED Notes (Addendum)
Pt. reports right eye pain and photophobia onset 2 days ago , denies injury / no drainage .

## 2014-06-29 NOTE — ED Provider Notes (Signed)
CSN: [id number]     Arrival date & time 06/29/14  0115 History  This chart was scribed for Corey Haynes Tameisha Covell, MD by Bronson CurbJacqueline Melvin, ED Scribe. This patient was seen in room B18C/B18C and the patient's care was started at 2:16 AM.   Chief Complaint  Patient presents with  . Eye Pain   Patient gave verbal permission to utilize photo for medical documentation only The image was not stored on any personal device  Patient is a 39 y.o. male presenting with eye pain. No language interpreter was used.  Eye Pain This is a recurrent problem. The current episode started 2 days ago. The problem occurs rarely. The problem has not changed since onset.Pertinent negatives include no chest pain, no abdominal pain, no headaches and no shortness of breath. Nothing aggravates the symptoms. Nothing relieves the symptoms. He has tried nothing for the symptoms. The treatment provided no relief.     HPI Comments: Corey Haynes is a 39 y.o. male who presents to the Emergency Department complaining of throbbing, 10/10, right eye pain that began 2 days ago. There is associated photophobia. He also notes decreased vision in the right at baseline, stating he can see shape, but not details. Patient denies any injury or trauma to the right eye. He reports history of Keratoconus to both eyes and notes past surgical history of corneal transplant in the left eye and reports the right eye needs to have a transplant as well. [facility], but has not f/u with them regarding his current symptoms. He reports history of intermittent pain to the right eye and was seen here 2 months ago for the same. He denies fever, nausea, vomiting, headache, chest pain, or SOB. Patient does not wear glasses or contact lenses.   Past Medical History  Diagnosis Date  . Hypertension    Past Surgical History  Procedure Laterality Date  . Corneal transplant     No family history on file. History  Substance Use  Topics  . Smoking status: Current Every Day Smoker -- 1.00 packs/day    Types: Cigarettes  . Smokeless tobacco: Not on file  . Alcohol Use: Yes    Review of Systems  Constitutional: Negative for fever.  Eyes: Positive for photophobia and pain.  Respiratory: Negative for shortness of breath.   Cardiovascular: Negative for chest pain.  Gastrointestinal: Negative for nausea, vomiting and abdominal pain.  Neurological: Negative for headaches.  All other systems reviewed and are negative.     Allergies  Aspirin and Shellfish allergy  Home Medications   Prior to Admission medications   Medication Sig Start Date End Date Taking? Authorizing Provider  HYDROcodone-acetaminophen (NORCO/VICODIN) 5-325 MG per tablet Take 1 tablet by mouth every 6 (six) hours as needed. 08/30/13   Derwood KaplanAnkit Nanavati, MD  oxyCODONE-acetaminophen (PERCOCET/ROXICET) 5-325 MG per tablet Take 1 tablet by mouth every 4 (four) hours as needed for severe pain. 05/14/14   Trixie DredgeEmily West, PA-C   Triage Vitals: BP 155/97 mmHg  Pulse 114  Temp(Src) 98.1 F (36.7 C) (Oral)  Resp 16  Ht 5\' 9"  (1.753 m)  Wt 205 lb (92.987 kg)  BMI 30.26 kg/m2  SpO2 97%  Physical Exam  Nursing note and vitals reviewed. CONSTITUTIONAL: Well developed/well nourished HEAD: Normocephalic/atraumatic EYES: see photo below ENMT: Mucous membranes moist NECK: supple no meningeal signs SPINE/BACK:entire spine nontender CV: S1/S2 noted, no murmurs/rubs/gallops noted LUNGS: Lungs are clear to auscultation bilaterally, no apparent distress ABDOMEN: soft, nontender, no rebound or  guarding, bowel sounds noted throughout abdomen NEURO: Pt is awake/alert/appropriate, moves all extremitiesx4.  No facial droop.   EXTREMITIES: pulses normal/equal, full ROM SKIN: warm, color normal PSYCH: no abnormalities of mood noted, alert and oriented to situation       ED Course  Procedures   DIAGNOSTIC STUDIES: Oxygen Saturation is 97% on room air, adequate  by my interpretation.    COORDINATION OF CARE: At 0219 Discussed treatment plan with patient which includes fluorescein stain, tetracaine and oxycodone. Patient agrees.    On reassessment pt admits he has had this pain for "awhile" and would like to go home with pain med and no further treatment.  He would not let me perform fluorescin or tonometry.  He reports this pain is similar to all other pain episodes.  He has been seen in the ED for this previously I advised close ophtho f/u as outpatient  MDM   Final diagnoses:  Pain in right eye    Nursing notes including past medical history and social history reviewed and considered in documentation   I personally performed the services described in this documentation, which was scribed in my presence. The recorded information has been reviewed and is accurate.      Corey Haynes Teola Felipe, MD 06/29/14 [phone]

## 2015-01-14 ENCOUNTER — Encounter (HOSPITAL_COMMUNITY): Payer: Self-pay | Admitting: Emergency Medicine

## 2015-01-14 ENCOUNTER — Emergency Department (HOSPITAL_COMMUNITY)
Admission: EM | Admit: 2015-01-14 | Discharge: 2015-01-14 | Disposition: A | Discharge status: Home / Self Care | Payer: Self-pay | Attending: Emergency Medicine | Admitting: Emergency Medicine

## 2015-01-14 DIAGNOSIS — I10 Essential (primary) hypertension: Secondary | ICD-10-CM | POA: Insufficient documentation

## 2015-01-14 DIAGNOSIS — M7710 Lateral epicondylitis, unspecified elbow: Secondary | ICD-10-CM | POA: Insufficient documentation

## 2015-01-14 DIAGNOSIS — K0889 Other specified disorders of teeth and supporting structures: Secondary | ICD-10-CM | POA: Insufficient documentation

## 2015-01-14 DIAGNOSIS — F17210 Nicotine dependence, cigarettes, uncomplicated: Secondary | ICD-10-CM | POA: Insufficient documentation

## 2015-01-14 MED ORDER — NAPROXEN 375 MG PO TABS: 375.0000 mg | ORAL_TABLET | Freq: Two times a day (BID) | ORAL | Status: DC

## 2015-01-14 MED ORDER — ACETAMINOPHEN 500 MG PO TABS
1000.0000 mg | ORAL_TABLET | Freq: Once | ORAL | Status: AC
  Administered 2015-01-14: 1000 mg via ORAL
  Filled 2015-01-14: qty 2

## 2015-01-14 MED ORDER — PENICILLIN V POTASSIUM 250 MG PO TABS
250.0000 mg | ORAL_TABLET | Freq: Once | ORAL | Status: AC
  Administered 2015-01-14: 250 mg via ORAL
  Filled 2015-01-14: qty 1

## 2015-01-14 MED ORDER — PENICILLIN V POTASSIUM 250 MG PO TABS: 250.0000 mg | ORAL_TABLET | Freq: Four times a day (QID) | ORAL | Status: AC

## 2015-01-14 NOTE — ED Notes (Signed)
Patient requested pain meds and antibiotic before leaving.

## 2015-01-14 NOTE — Discharge Instructions (Signed)
Lateral Epicondylitis With Rehab Lateral epicondylitis involves inflammation and pain around the outer portion of the elbow. The pain is caused by inflammation of the tendons in the forearm that bring back (extend) the wrist. Lateral epicondylitis is also called tennis elbow, because it is very common in tennis players. However, it may occur in any individual who extends the wrist repetitively. If lateral epicondylitis is left untreated, it may become a chronic problem. SYMPTOMS   Pain, tenderness, and inflammation on the outer (lateral) side of the elbow.  Pain or weakness with gripping activities.  Pain that increases with wrist-twisting motions (playing tennis, using a screwdriver, opening a door or a jar).  Pain with lifting objects, including a coffee cup. CAUSES  Lateral epicondylitis is caused by inflammation of the tendons that extend the wrist. Causes of injury may include:  Repetitive stress and strain on the muscles and tendons that extend the wrist.  Sudden change in activity level or intensity.  Incorrect grip in racquet sports.  Incorrect grip size of racquet (often too large).  Incorrect hitting position or technique (usually backhand, leading with the elbow).  Using a racket that is too heavy. RISK INCREASES WITH:  Sports or occupations that require repetitive and/or strenuous forearm and wrist movements (tennis, squash, racquetball, carpentry).  Poor wrist and forearm strength and flexibility.  Failure to warm up properly before activity.  Resuming activity before healing, rehabilitation, and conditioning are complete. PREVENTION   Warm up and stretch properly before activity.  Maintain physical fitness:  Strength, flexibility, and endurance.  Cardiovascular fitness.  Wear and use properly fitted equipment.  Learn and use proper technique and have a coach correct improper technique.  Wear a tennis elbow (counterforce) brace. PROGNOSIS  The course of  this condition depends on the degree of the injury. If treated properly, acute cases (symptoms lasting less than 4 weeks) are often resolved in 2 to 6 weeks. Chronic (longer lasting cases) often resolve in 3 to 6 months but may require physical therapy. RELATED COMPLICATIONS   Frequently recurring symptoms, resulting in a chronic problem. Properly treating the problem the first time decreases frequency of recurrence.  Chronic inflammation, scarring tendon degeneration, and partial tendon tear, requiring surgery.  Delayed healing or resolution of symptoms. TREATMENT  Treatment first involves the use of ice and medicine to reduce pain and inflammation. Strengthening and stretching exercises may help reduce discomfort if performed regularly. These exercises may be performed at home if the condition is an acute injury. Chronic cases may require a referral to a physical therapist for evaluation and treatment. Your caregiver may advise a corticosteroid injection to help reduce inflammation. Rarely, surgery is needed. MEDICATION  If pain medicine is needed, nonsteroidal anti-inflammatory medicines (aspirin and ibuprofen), or other minor pain relievers (acetaminophen), are often advised.  Do not take pain medicine for 7 days before surgery.  Prescription pain relievers may be given, if your caregiver thinks they are needed. Use only as directed and only as much as you need.  Corticosteroid injections may be recommended. These injections should be reserved only for the most severe cases, because they can only be given a certain number of times. HEAT AND COLD  Cold treatment (icing) should be applied for 10 to 15 minutes every 2 to 3 hours for inflammation and pain, and immediately after activity that aggravates your symptoms. Use ice packs or an ice massage.  Heat treatment may be used before performing stretching and strengthening activities prescribed by your caregiver, physical therapist,  or  Product/process development scientist. Use a heat pack or a warm water soak. SEEK MEDICAL CARE IF: Symptoms get worse or do not improve in 2 weeks, despite treatment. EXERCISES  RANGE OF MOTION (ROM) AND STRETCHING EXERCISES - Epicondylitis, Lateral (Tennis Elbow) These exercises may help you when beginning to rehabilitate your injury. Your symptoms may go away with or without further involvement from your physician, physical therapist, or athletic trainer. While completing these exercises, remember:   Restoring tissue flexibility helps normal motion to return to the joints. This allows healthier, less painful movement and activity.  An effective stretch should be held for at least 30 seconds.  A stretch should never be painful. You should only feel a gentle lengthening or release in the stretched tissue. RANGE OF MOTION - Wrist Flexion, Active-Assisted  Extend your right / left elbow with your fingers pointing down.*  Gently pull the back of your hand towards you, until you feel a gentle stretch on the top of your forearm.  Hold this position for __________ seconds. Repeat __________ times. Complete this exercise __________ times per day.  *If directed by your physician, physical therapist or athletic trainer, complete this stretch with your elbow bent, rather than extended. RANGE OF MOTION - Wrist Extension, Active-Assisted  Extend your right / left elbow and turn your palm upwards.*  Gently pull your palm and fingertips back, so your wrist extends and your fingers point more toward the ground.  You should feel a gentle stretch on the inside of your forearm.  Hold this position for __________ seconds. Repeat __________ times. Complete this exercise __________ times per day. *If directed by your physician, physical therapist or athletic trainer, complete this stretch with your elbow bent, rather than extended. STRETCH - Wrist Flexion  Place the back of your right / left hand on a tabletop, leaving your  elbow slightly bent. Your fingers should point away from your body.  Gently press the back of your hand down onto the table by straightening your elbow. You should feel a stretch on the top of your forearm.  Hold this position for __________ seconds. Repeat __________ times. Complete this stretch __________ times per day.  STRETCH - Wrist Extension   Place your right / left fingertips on a tabletop, leaving your elbow slightly bent. Your fingers should point backwards.  Gently press your fingers and palm down onto the table by straightening your elbow. You should feel a stretch on the inside of your forearm.  Hold this position for __________ seconds. Repeat __________ times. Complete this stretch __________ times per day.  STRENGTHENING EXERCISES - Epicondylitis, Lateral (Tennis Elbow) These exercises may help you when beginning to rehabilitate your injury. They may resolve your symptoms with or without further involvement from your physician, physical therapist, or athletic trainer. While completing these exercises, remember:   Muscles can gain both the endurance and the strength needed for everyday activities through controlled exercises.  Complete these exercises as instructed by your physician, physical therapist or athletic trainer. Increase the resistance and repetitions only as guided.  You may experience muscle soreness or fatigue, but the pain or discomfort you are trying to eliminate should never worsen during these exercises. If this pain does get worse, stop and make sure you are following the directions exactly. If the pain is still present after adjustments, discontinue the exercise until you can discuss the trouble with your caregiver. STRENGTH - Wrist Flexors  Sit with your right / left forearm palm-up and fully supported  on a table or countertop. Your elbow should be resting below the height of your shoulder. Allow your wrist to extend over the edge of the  surface.  Loosely holding a __________ weight, or a piece of rubber exercise band or tubing, slowly curl your hand up toward your forearm.  Hold this position for __________ seconds. Slowly lower the wrist back to the starting position in a controlled manner. Repeat __________ times. Complete this exercise __________ times per day.  STRENGTH - Wrist Extensors  Sit with your right / left forearm palm-down and fully supported on a table or countertop. Your elbow should be resting below the height of your shoulder. Allow your wrist to extend over the edge of the surface.  Loosely holding a __________ weight, or a piece of rubber exercise band or tubing, slowly curl your hand up toward your forearm.  Hold this position for __________ seconds. Slowly lower the wrist back to the starting position in a controlled manner. Repeat __________ times. Complete this exercise __________ times per day.  STRENGTH - Ulnar Deviators  Stand with a ____________________ weight in your right / left hand, or sit while holding a rubber exercise band or tubing, with your healthy arm supported on a table or countertop.  Move your wrist, so that your pinkie travels toward your forearm and your thumb moves away from your forearm.  Hold this position for __________ seconds and then slowly lower the wrist back to the starting position. Repeat __________ times. Complete this exercise __________ times per day STRENGTH - Radial Deviators  Stand with a ____________________ weight in your right / left hand, or sit while holding a rubber exercise band or tubing, with your injured arm supported on a table or countertop.  Raise your hand upward in front of you or pull up on the rubber tubing.  Hold this position for __________ seconds and then slowly lower the wrist back to the starting position. Repeat __________ times. Complete this exercise __________ times per day. STRENGTH - Forearm Supinators   Sit with your right /  left forearm supported on a table, keeping your elbow below shoulder height. Rest your hand over the edge, palm down.  Gently grip a hammer or a soup ladle.  Without moving your elbow, slowly turn your palm and hand upward to a "thumbs-up" position.  Hold this position for __________ seconds. Slowly return to the starting position. Repeat __________ times. Complete this exercise __________ times per day.  STRENGTH - Forearm Pronators   Sit with your right / left forearm supported on a table, keeping your elbow below shoulder height. Rest your hand over the edge, palm up.  Gently grip a hammer or a soup ladle.  Without moving your elbow, slowly turn your palm and hand upward to a "thumbs-up" position.  Hold this position for __________ seconds. Slowly return to the starting position. Repeat __________ times. Complete this exercise __________ times per day.  STRENGTH - Grip  Grasp a tennis ball, a dense sponge, or a large, rolled sock in your hand.  Squeeze as hard as you can, without increasing any pain.  Hold this position for __________ seconds. Release your grip slowly. Repeat __________ times. Complete this exercise __________ times per day.  STRENGTH - Elbow Extensors, Isometric  Stand or sit upright, on a firm surface. Place your right / left arm so that your palm faces your stomach, and it is at the height of your waist.  Place your opposite hand on the underside  of your forearm. Gently push up as your right / left arm resists. Push as hard as you can with both arms, without causing any pain or movement at your right / left elbow. Hold this stationary position for __________ seconds. Gradually release the tension in both arms. Allow your muscles to relax completely before repeating.   This information is not intended to replace advice given to you by your health care provider. Make sure you discuss any questions you have with your health care provider.   Document Released:  02/11/2005 Document Revised: 03/04/2014 Document Reviewed: 05/26/2008 Elsevier Interactive Patient Education 2016 Else[employer] Inc[employer] Dental Pain Dental pain may be caused by many things, including:  Tooth decay (cavities or caries). Cavities expose the nerve of your tooth to air and hot or cold temperatures. This can cause pain or discomfort.  Abscess or infection. A dental abscess is a collection of infected pus from a bacterial infection in the inner part of the tooth (pulp). It usually occurs at the end of the tooth's root.  Injury.  An unknown reason (idiopathic). Your pain may be mild or severe. It may only occur when:  You are chewing.  You are exposed to hot or cold temperature.  You are eating or drinking sugary foods or beverages, such as soda or candy. Your pain may also be constant. HOME CARE INSTRUCTIONS Watch your dental pain for any changes. The following actions may help to lessen any discomfort that you are feeling:  Take medicines only as directed by your dentist.  If you were prescribed an antibiotic medicine, finish all of it even if you start to feel better.  Keep all follow-up visits as directed by your dentist. This is important.  Do not apply heat to the outside of your face.  Rinse your mouth or gargle with salt water if directed by your dentist. This helps with pain and swelling.  You can make salt water by adding  tsp of salt to 1 cup of warm water.  Apply ice to the painful area of your face:  Put ice in a plastic bag.  Place a towel between your skin and the bag.  Leave the ice on for 20 minutes, 2-3 times per day.  Avoid foods or drinks that cause you pain, such as:  Very hot or very cold foods or drinks.  Sweet or sugary foods or drinks. SEEK MEDICAL CARE IF:  Your pain is not controlled with medicines.  Your symptoms are worse.  You have new symptoms. SEEK IMMEDIATE MEDICAL CARE IF:  You are unable to open your mouth.  You are  having trouble breathing or swallowing.  You have a fever.  Your face, neck, or jaw is swollen.   This information is not intended to replace advice given to you by your health care provider. Make sure you discuss any questions you have with your health care provider.   Document Released: 02/11/2005 Document Revised: 06/28/2014 Document Reviewed: 02/07/2014 Elsevier Interactive Patient Education [employer] Inc[employer]

## 2015-01-14 NOTE — ED Notes (Signed)
Pt. 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 added right lower molar pain with swelling onset this week .

## 2015-01-14 NOTE — ED Provider Notes (Signed)
CSN: [id number]     Arrival date & time 01/14/15  2039 History   First MD Initiated Contact with Patient 01/14/15 2051     Chief Complaint  Patient presents with  . Arm Pain  . Dental Pain     (Consider location/radiation/quality/duration/timing/severity/associated sxs/prior Treatment) HPI Comments: Patient presents to the emergency department with multiple complaints.  1. Dental pain: Patient reports having dental pain for the past week. He states that it is on his right lower molar. He reports mild swelling. States that the pain is worsened with eating. He has tried taking Tylenol and ibuprofen with no relief. He does not have a dentist. He states that he is getting dental insurance in 2 months.  2. Bilateral arm pain: Patient reports bilateral forearm pain times many months. Recently worsened over the past 2 months. He states that he has pain with wrist flexion and extension. 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 He is concerned that he has tendinitis. He has not tried taking anything for his symptoms. He denies any specific injury.  The history is provided by the patient. No language interpreter was used.    Past Medical History  Diagnosis Date  . Hypertension    Past Surgical History  Procedure Laterality Date  . Corneal transplant     History reviewed. No pertinent family history. Social History  Substance Use Topics  . Smoking status: Current Every Day Smoker -- 0.00 packs/day    Types: Cigarettes  . Smokeless tobacco: None  . Alcohol Use: Yes    Review of Systems  Constitutional: Negative for fever and chills.  HENT: Positive for dental problem.   Respiratory: Negative for shortness of breath.   Cardiovascular: Negative for chest pain.  Gastrointestinal: Negative for nausea, vomiting, diarrhea and constipation.  Genitourinary: Negative for dysuria.  Musculoskeletal: Positive for myalgias.      Allergies  Aspirin and  Shellfish allergy  Home Medications   Prior to Admission medications   Not on File   BP 144/99 mmHg  Pulse 98  Temp(Src) 99.1 F (37.3 C) (Oral)  Resp 16  Ht  (1.753 m)  Wt 205 lb (92.987 kg)  BMI 30.26 kg/m2  SpO2 99% Physical Exam  Constitutional: He is oriented to person, place, and time. He appears well-developed and well-nourished.  HENT:  Head: Normocephalic and atraumatic.  Mouth/Throat:    Poor dentition throughout.  Affected tooth as diagrammed.  No signs of peritonsillar or tonsillar abscess.  No signs of gingival abscess. Oropharynx is clear and without exudates.  Uvula is midline.  Airway is intact. No signs of Ludwig's angina with palpation of oral and sublingual mucosa.   Eyes: Conjunctivae and EOM are normal.  Neck: Normal range of motion.  Cardiovascular: Normal rate.   Pulmonary/Chest: Effort normal.  Abdominal: He exhibits no distension.  Musculoskeletal: Normal range of motion.  Pain over bilateral lateral epicondyles with resisted pronation Positive Tinel Positive Phalen No bony abnormality or deformity  Neurological: He is alert and oriented to person, place, and time.  Skin: Skin is dry.  Psychiatric: He has a normal mood and affect. His behavior is normal. Judgment and thought content normal.  Nursing note and vitals reviewed.   ED Course  Procedures (including critical care time) Labs Review Labs Reviewed - No data to display  Imaging Review No results found. I have personally reviewed and evaluated these images and lab results as part of my medical decision-making.  EKG Interpretation None      MDM   Final diagnoses:  Pain, dental  Lateral epicondylitis, unspecified laterality    Patient with toothache.  No gross abscess.  Exam unconcerning for Ludwig's angina or spread of infection.  Will treat with penicillin and OTC pain medicine.  Urged patient to follow-up with dentist.    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 Recommend physical therapy/sports medicine f/u.  RICE therapy and stretches.     Roxy HorsemanRobert Ersie Savino, PA-C 01/14/15 2111  Richardean Canalavid H Yao, MD 01/14/15 [phone]

## 2015-08-03 ENCOUNTER — Emergency Department (HOSPITAL_COMMUNITY)
Admission: EM | Admit: 2015-08-03 | Discharge: 2015-08-03 | Disposition: A | Discharge status: Home / Self Care | Payer: Self-pay | Attending: Emergency Medicine | Admitting: Emergency Medicine

## 2015-08-03 ENCOUNTER — Encounter (HOSPITAL_COMMUNITY): Payer: Self-pay

## 2015-08-03 ENCOUNTER — Emergency Department (HOSPITAL_COMMUNITY): Payer: Self-pay

## 2015-08-03 DIAGNOSIS — F17210 Nicotine dependence, cigarettes, uncomplicated: Secondary | ICD-10-CM | POA: Insufficient documentation

## 2015-08-03 DIAGNOSIS — W11XXXA Fall on and from ladder, initial encounter: Secondary | ICD-10-CM | POA: Insufficient documentation

## 2015-08-03 DIAGNOSIS — Y9289 Other specified places as the place of occurrence of the external cause: Secondary | ICD-10-CM | POA: Insufficient documentation

## 2015-08-03 DIAGNOSIS — Y998 Other external cause status: Secondary | ICD-10-CM | POA: Insufficient documentation

## 2015-08-03 DIAGNOSIS — Z791 Long term (current) use of non-steroidal anti-inflammatories (NSAID): Secondary | ICD-10-CM | POA: Insufficient documentation

## 2015-08-03 DIAGNOSIS — I10 Essential (primary) hypertension: Secondary | ICD-10-CM | POA: Insufficient documentation

## 2015-08-03 DIAGNOSIS — Y9389 Activity, other specified: Secondary | ICD-10-CM | POA: Insufficient documentation

## 2015-08-03 DIAGNOSIS — M25511 Pain in right shoulder: Secondary | ICD-10-CM

## 2015-08-03 DIAGNOSIS — S4991XA Unspecified injury of right shoulder and upper arm, initial encounter: Secondary | ICD-10-CM | POA: Insufficient documentation

## 2015-08-03 MED ORDER — OXYCODONE-ACETAMINOPHEN 5-325 MG PO TABS
1.0000 | ORAL_TABLET | Freq: Once | ORAL | Status: AC
  Administered 2015-08-03: 1 via ORAL
  Filled 2015-08-03: qty 1

## 2015-08-03 MED ORDER — NAPROXEN 500 MG PO TABS: 500.0000 mg | ORAL_TABLET | Freq: Two times a day (BID) | ORAL | Status: DC

## 2015-08-03 NOTE — Discharge Instructions (Signed)

## 2015-08-03 NOTE — ED Notes (Signed)
Pt left before receiving discharge instructions and Rx's.  Pt was seen leaving ED after receiving his Percocet

## 2015-08-03 NOTE — ED Provider Notes (Signed)
CSN: [id number]     Arrival date & time 08/03/15  1944 History  By signing my name below, I, Placido Sou, attest that this documentation has been prepared under the direction and in the presence of Felicie Morn, NP. Electronically Signed: Placido Sou, ED Scribe. 08/03/2015. 10:08 PM.   Chief Complaint  Patient presents with  . Shoulder Injury   Patient is a 40 y.o. male presenting with shoulder injury. The history is provided by the patient. No language interpreter was used.  Shoulder Injury This is a new problem. The current episode started yesterday. The problem occurs constantly. The problem has been gradually worsening. The symptoms are aggravated by bending and twisting. Nothing relieves the symptoms. He has tried nothing for the symptoms. The treatment provided no relief.    HPI Comments: Corey Haynes is a 40 y.o. male who presents to the Emergency Department complaining of worsening, moderate, right shoulder pain x 1 day. He states that he was descending a wet ladder, slipped and caught himself with his right arm resulting in a sudden onset of his right shoulder pain. His pain worsens with RUE movement or palpation of the right shoulder. Pt denies having a PCP or current health insurance. He denies any other associated symptoms at this time.   Past Medical History  Diagnosis Date  . Hypertension    Past Surgical History  Procedure Laterality Date  . Corneal transplant     No family history on file. Social History  Substance Use Topics  . Smoking status: Current Every Day Smoker -- 0.00 packs/day    Types: Cigarettes  . Smokeless tobacco: None  . Alcohol Use: Yes    Review of Systems  Musculoskeletal: Positive for arthralgias. Negative for joint swelling.  Skin: Negative for wound.  All other systems reviewed and are negative.   Allergies  Aspirin and Shellfish allergy  Home Medications   Prior to Admission medications   Medication Sig Start Date End Date  Taking? Authorizing Provider  naproxen (NAPROSYN) 375 MG tablet Take 1 tablet (375 mg total) by mouth 2 (two) times daily. 01/14/15   Roxy Horseman, PA-C   BP 138/83 mmHg  Pulse 96  Temp(Src) 98.8 F (37.1 C) (Oral)  Resp 18  SpO2 95%    Physical Exam  Constitutional: He is oriented to person, place, and time. He appears well-developed and well-nourished.  HENT:  Head: Normocephalic and atraumatic.  Eyes: EOM are normal.  Neck: Normal range of motion.  Cardiovascular: Normal rate, regular rhythm and normal heart sounds.  Exam reveals no gallop and no friction rub.   No murmur heard. Pulmonary/Chest: Effort normal and breath sounds normal. No respiratory distress. He has no wheezes. He has no rales.  Abdominal: Soft. There is no tenderness.  Musculoskeletal: He exhibits tenderness.  TTP to the right trapezius and right shoulder; DROM due to pain; no strength deficits or loss of sensation  Neurological: He is alert and oriented to person, place, and time.  Skin: Skin is warm and dry.  Psychiatric: He has a normal mood and affect.  Nursing note and vitals reviewed.   ED Course  Procedures  DIAGNOSTIC STUDIES: Oxygen Saturation is 95% on RA, normal by my interpretation.    COORDINATION OF CARE: 10:03 PM Discussed imaging results and next steps with pt. Pt verbalized understanding and is agreeable with the plan.   Labs Review Labs Reviewed - No data to display  Imaging Review Dg Shoulder Right  08/03/2015  CLINICAL DATA:  Fall  from ladder yesterday with persistent right shoulder pain, initial encounter EXAM: RIGHT SHOULDER - 2+ VIEW COMPARISON:  None. FINDINGS: There is no evidence of fracture or dislocation. There is no evidence of arthropathy or other focal bone abnormality. Soft tissues are unremarkable. IMPRESSION: No acute abnormality noted. Electronically Signed   By: Alcide CleverMark  Lukens M.D.   On: 08/03/2015 21:01   I have personally reviewed and evaluated these images as part  of my medical decision-making.   EKG Interpretation None     Radiology results reviewed and shared with patient. MDM   Final diagnoses:  None  Patient X-Ray negative for obvious fracture or dislocation.  Pt advised to follow up with orthopedics. Patient given shoulder immobilizer while in ED, conservative therapy recommended and discussed. Patient will be discharged home & is agreeable with above plan. Returns precautions discussed. Pt appears safe for discharge.   I personally performed the services described in this documentation, which was scribed in my presence. The recorded information has been reviewed and is accurate.    Felicie Mornavid Brice Kossman, NP 08/04/15 0025  Doug SouSam Jacubowitz, MD 08/04/15 [id number]

## 2015-08-03 NOTE — ED Notes (Signed)
Pt reports he slipped off a ladder yesterday. He states he grabbed the ladder and since his right shoulder has been hurting.

## 2015-08-17 ENCOUNTER — Emergency Department (HOSPITAL_COMMUNITY)
Admission: EM | Admit: 2015-08-17 | Discharge: 2015-08-17 | Disposition: A | Discharge status: Home / Self Care | Payer: Self-pay | Attending: Emergency Medicine | Admitting: Emergency Medicine

## 2015-08-17 ENCOUNTER — Encounter (HOSPITAL_COMMUNITY): Payer: Self-pay | Admitting: Emergency Medicine

## 2015-08-17 ENCOUNTER — Emergency Department (HOSPITAL_COMMUNITY): Payer: Self-pay

## 2015-08-17 DIAGNOSIS — F17210 Nicotine dependence, cigarettes, uncomplicated: Secondary | ICD-10-CM | POA: Insufficient documentation

## 2015-08-17 DIAGNOSIS — K047 Periapical abscess without sinus: Secondary | ICD-10-CM | POA: Insufficient documentation

## 2015-08-17 DIAGNOSIS — I10 Essential (primary) hypertension: Secondary | ICD-10-CM | POA: Insufficient documentation

## 2015-08-17 LAB — BASIC METABOLIC PANEL
ANION GAP: 4 — AB (ref 5–15)
BUN: 16 mg/dL (ref 6–20)
CO2: 27 mmol/L (ref 22–32)
Calcium: 8.5 mg/dL — ABNORMAL LOW (ref 8.9–10.3)
Chloride: 109 mmol/L (ref 101–111)
Creatinine, Ser: 1.24 mg/dL (ref 0.61–1.24)
GFR calc Af Amer: >60 mL/min (ref >60)
GFR calc non Af Amer: >60 mL/min (ref >60)
GLUCOSE: 87 mg/dL (ref 65–99)
POTASSIUM: 3.8 mmol/L (ref 3.5–5.1)
Sodium: 140 mmol/L (ref 135–145)

## 2015-08-17 LAB — CBC WITH DIFFERENTIAL/PLATELET
BASOS PCT: 0 %
Basophils Absolute: 0 10*3/uL (ref 0.0–0.1)
Eosinophils Absolute: 0.4 10*3/uL (ref 0.0–0.7)
Eosinophils Relative: 3 %
HCT: 40.3 % (ref 39.0–52.0)
HEMOGLOBIN: 13.1 g/dL (ref 13.0–17.0)
Lymphocytes Relative: 19 %
Lymphs Abs: 2.4 10*3/uL (ref 0.7–4.0)
MCH: 28.5 pg (ref 26.0–34.0)
MCHC: 32.5 g/dL (ref 30.0–36.0)
MCV: 87.8 fL (ref 78.0–100.0)
MONOS PCT: 6 %
Monocytes Absolute: 0.8 10*3/uL (ref 0.1–1.0)
NEUTROS ABS: 9.1 10*3/uL — AB (ref 1.7–7.7)
Neutrophils Relative %: 72 %
Platelets: 289 10*3/uL (ref 150–400)
RBC: 4.59 MIL/uL (ref 4.22–5.81)
RDW: 14.6 % (ref 11.5–15.5)
WBC: 12.7 10*3/uL — ABNORMAL HIGH (ref 4.0–10.5)

## 2015-08-17 MED ORDER — HYDROMORPHONE HCL 1 MG/ML IJ SOLN
1.0000 mg | Freq: Once | INTRAMUSCULAR | Status: AC
  Administered 2015-08-17: 1 mg via INTRAVENOUS
  Filled 2015-08-17: qty 1

## 2015-08-17 MED ORDER — MORPHINE SULFATE (PF) 4 MG/ML IV SOLN: 4.0000 mg | Freq: Once | INTRAVENOUS | Status: DC

## 2015-08-17 MED ORDER — CLINDAMYCIN HCL 300 MG PO CAPS: 300.0000 mg | ORAL_CAPSULE | Freq: Four times a day (QID) | ORAL | Status: DC

## 2015-08-17 MED ORDER — CLINDAMYCIN PHOSPHATE 600 MG/50ML IV SOLN
600.0000 mg | Freq: Once | INTRAVENOUS | Status: AC
  Administered 2015-08-17: 600 mg via INTRAVENOUS
  Filled 2015-08-17: qty 50

## 2015-08-17 MED ORDER — IOPAMIDOL (ISOVUE-300) INJECTION 61%
INTRAVENOUS | Status: AC
  Administered 2015-08-17: 75 mL
  Filled 2015-08-17: qty 75

## 2015-08-17 MED ORDER — IBUPROFEN 800 MG PO TABS: 800.0000 mg | ORAL_TABLET | Freq: Three times a day (TID) | ORAL | Status: DC | PRN

## 2015-08-17 MED ORDER — OXYCODONE-ACETAMINOPHEN 5-325 MG PO TABS: 1.0000 | ORAL_TABLET | ORAL | Status: DC | PRN

## 2015-08-17 MED ORDER — SODIUM CHLORIDE 0.9 % IV BOLUS (SEPSIS)
1000.0000 mL | Freq: Once | INTRAVENOUS | Status: AC
  Administered 2015-08-17: 1000 mL via INTRAVENOUS

## 2015-08-17 NOTE — ED Provider Notes (Signed)
CSN: [id number]     Arrival date & time 08/17/15  1017 History   First MD Initiated Contact with Patient 08/17/15 1100     Chief Complaint  Patient presents with  . Dental Pain     (Consider location/radiation/quality/duration/timing/severity/associated sxs/prior Treatment) HPI   Pt with hx HTN p/w right sided dental pain and facial swelling over right jaw that began two days ago.  Has been taking ibuprofen and penicillin without relief.   Has had associated subjective fevers.  Denies any difficulty swallowing or breathing.  Has appointment with his dentist on Monday.    Past Medical History  Diagnosis Date  . Hypertension    Past Surgical History  Procedure Laterality Date  . Corneal transplant     No family history on file. Social History  Substance Use Topics  . Smoking status: Current Every Day Smoker -- 0.50 packs/day    Types: Cigarettes  . Smokeless tobacco: None  . Alcohol Use: Yes     Comment: socially    Review of Systems  Constitutional: Positive for fever and chills.  HENT: Positive for dental problem and facial swelling. Negative for sore throat and trouble swallowing.   Respiratory: Negative for shortness of breath, wheezing and stridor.   Musculoskeletal: Negative for myalgias.  Skin: Negative for rash.  Allergic/Immunologic: Negative for immunocompromised state.  Psychiatric/Behavioral: Negative for self-injury.      Allergies  Aspirin and Shellfish allergy  Home Medications   Prior to Admission medications   Medication Sig Start Date End Date Taking? Authorizing Provider  naproxen (NAPROSYN) 500 MG tablet Take 1 tablet (500 mg total) by mouth 2 (two) times daily. 08/03/15   Felicie Mornavid Smith, NP   BP 132/83 mmHg  Pulse 75  Temp(Src) 99.9 F (37.7 C) (Oral)  Resp 20  SpO2 100% Physical Exam  Constitutional: He appears well-developed and well-nourished. No distress.  Uncomfortable appearing  HENT:  Head: Normocephalic and atraumatic.   Mouth/Throat: Uvula is midline and oropharynx is clear and moist. Mucous membranes are not dry. No trismus in the jaw. Dental abscesses and dental caries present. No uvula swelling. No oropharyngeal exudate, posterior oropharyngeal edema, posterior oropharyngeal erythema or tonsillar abscesses.  Tenderness and swelling over right lower mandible with associated submandibular lymphadenopathy.  Right lower molars with severe decay.    Neck: Normal range of motion. Neck supple.  Cardiovascular: Normal rate.   Pulmonary/Chest: Effort normal and breath sounds normal. No stridor.  Lymphadenopathy:    He has no cervical adenopathy.  Neurological: He is alert.  Skin: He is not diaphoretic.  Nursing note and vitals reviewed.   ED Course  Procedures (including critical care time) Labs Review Labs Reviewed  BASIC METABOLIC PANEL - Abnormal; Notable for the following:    Calcium 8.5 (*)    Anion gap 4 (*)    All other components within normal limits  CBC WITH DIFFERENTIAL/PLATELET - Abnormal; Notable for the following:    WBC 12.7 (*)    Neutro Abs 9.1 (*)    All other components within normal limits    Imaging Review Ct Soft Tissue Neck W Contrast  08/17/2015  CLINICAL DATA:  40 year old hypertensive male with broken lower right molar for the past year. Now with swelling and pain surrounding this region. Initial encounter. EXAM: CT NECK WITH CONTRAST TECHNIQUE: Multidetector CT imaging of the neck was performed using the standard protocol following the bolus administration of intravenous contrast. CONTRAST:  75mL ISOVUE-300 IOPAMIDOL (ISOVUE-300) INJECTION 61% COMPARISON:  None. FINDINGS:  Pharynx and larynx: Symmetric prominence palatine tonsils. Mild prominence adenoidal tissue. Prominence of lingual tonsils with extension into the vallecula minimally more notable on left. Salivary glands: No primary parotid or submandibular mass or inflammation. Thyroid: Negative. Lymph nodes: Increase number of  normal size to slightly enlarged lymph nodes bilaterally largest in the level 2 region. Vascular: No venous thrombosis or carotid stenosis. Ectatic left carotid artery Limited intracranial: Negative. Visualized orbits: Negative. Mastoids and visualized paranasal sinuses: Partial opacification right mastoid air cells. Dysfunction of the eustachian tube may be caused by prominent adenoids. Skeleton: Marked caries right lower molars with extension of inflammatory process into surrounding soft tissue surrounding the body and angle of the right mandible. Mild central low density but without clear well-defined drainable abscess at the present time. Upper chest: Negative. IMPRESSION: Marked caries right lower molars with extension of inflammatory process into surrounding soft tissue surrounding the body and angle of the right mandible. Mild central low density but without clear well-defined drainable abscess at the present time. Prominent lymphoid tissue and lymph nodes probably represent reactive changes given the above described inflammatory process. No significant asymmetry to suggest underlying laryngeal mass. Lymphoma felt much less likely consideration. Partial opacification right mastoid air cells. Dysfunction of eustachian tube may be caused by prominent adenoids. Electronically Signed   By: Lacy DuverneySteven  Olson M.D.   On: 08/17/2015 13:33   I have personally reviewed and evaluated these images and lab results as part of my medical decision-making.   EKG Interpretation None      MDM   Final diagnoses:  Dental abscess    Afebrile, nontoxic patient with new dental pain and obvious abscess.  No airway concerns. No e/o Ludwig's angina.  D/C home with antibiotic, pain medication and dental follow up.  Discussed findings, treatment, and follow up  with patient.  Pt given return precautions.  Pt verbalizes understanding and agrees with plan.        Trixie Dredgemily Arietta Eisenstein, PA-C 08/17/15 1638  Geoffery Lyonsouglas Delo, MD 08/17/15  [phone]

## 2015-08-17 NOTE — ED Notes (Signed)
Patient states broken tooth lower R molar.   Patient states has been broken x 1 year.   Patient states now having swelling and pain to the surrounding area.   Patient states he tried to contact dentist, but they are not open again until Monday.   Patient denies any drainage to the area.  Patient took advil at home for pain, unable to tell me how many he has taken over the last 2 days.

## 2015-08-17 NOTE — ED Provider Notes (Signed)
CSN: [id number]     Arrival date & time 08/17/15  1017 History  By signing my name below, I, Corey Haynes, attest that this documentation has been prepared under the direction and in the presence of Morgan StanleyJamie Diyana Starrett, PA-C.  Electronically Signed: Octavia HeirArianna Haynes, ED Scribe. 08/17/2015. 11:09 AM.    Chief Complaint  Patient presents with  . Dental Pain       The history is provided by the patient. No language interpreter was used.   HPI Comments: Corey Lauthntonio Haynes is a 40 y.o. male who has a PMHx of HTN presents to the Emergency Department complaining of sudden onset, gradual worsening, moderate, right lower dental pain onset two days ago. He reports associated mild trismus and swelling to the area. Pt notes he was supposed to get the tooth pulled today but the dental clinic was closed so he won't be able to see them until Monday. Pt states he has been taking ibuprofen and penicillin to alleviate his pain with no relief. He says that he has had pain in that tooth in the past and notes it has been broken for one year. Pt has been told he has HTN but not on medication. He denies drainage from the area and trouble swallowing.  Past Medical History  Diagnosis Date  . Hypertension    Past Surgical History  Procedure Laterality Date  . Corneal transplant     No family history on file. Social History  Substance Use Topics  . Smoking status: Current Every Day Smoker -- 0.50 packs/day    Types: Cigarettes  . Smokeless tobacco: None  . Alcohol Use: Yes     Comment: socially    Review of Systems  HENT: Positive for dental problem and facial swelling. Negative for trouble swallowing.   Respiratory: Negative for cough and shortness of breath.   All other systems reviewed and are negative.     Allergies  Aspirin and Shellfish allergy  Home Medications   Prior to Admission medications   Medication Sig Start Date End Date Taking? Authorizing Provider  naproxen (NAPROSYN) 500 MG tablet Take 1  tablet (500 mg total) by mouth 2 (two) times daily. 08/03/15   Felicie Mornavid Smith, NP   Triage vitals: BP 206/180 mmHg  Pulse 88  Temp(Src) 99.9 F (37.7 C) (Oral)  Resp 24  SpO2 99% Physical Exam  Constitutional: He is oriented to person, place, and time. He appears well-developed and well-nourished.  HENT:  Head: Normocephalic.  Mild trismus-able to open 2 fingers width Significant tenderness to lower right gingiva.  Submandibular right sided tenderness.  Midline uvula, oropharynx moist and clear, Mild facial edema.   Eyes: EOM are normal.  Neck: Normal range of motion.  Cardiovascular: Normal rate and regular rhythm.   Pulmonary/Chest: Effort normal.  Abdominal: He exhibits no distension.  Musculoskeletal: Normal range of motion.  Neurological: He is alert and oriented to person, place, and time.  Psychiatric: He has a normal mood and affect.  Nursing note and vitals reviewed.   ED Course  Procedures  DIAGNOSTIC STUDIES: Oxygen Saturation is 99% on RA, normal by my interpretation.  COORDINATION OF CARE:  11:06 AM Will order imaging of area. Discussed treatment plan which includes IV pain medication with pt at bedside and pt agreed to plan.  Labs Review Labs Reviewed  BASIC METABOLIC PANEL  CBC WITH DIFFERENTIAL/PLATELET    Imaging Review No results found. I have personally reviewed and evaluated these images and lab results as part of my medical  decision-making.   EKG Interpretation None      MDM   Final diagnoses:  None   Corey Haynes presents to ED for right lower dental pain. Denies difficulty breathing or swallowing. O2 95-99% on RA in ED. On exam, patient has mild trismus and right submandibular tenderness. CT ordered to assess for spreading infection. CBC, BMP ordered. Pain meds & ABX ordered. Will transfer patient to higher acuity area of ED for further evaluation and management. Case discussed with PA ChadWest who will assume care.   I personally performed the  services described in this documentation, which was scribed in my presence. The recorded information has been reviewed and is accurate.    [facility] Surgery CenterJaime Pilcher Yancarlos Berthold, PA-C 08/17/15 1128  Bethann BerkshireJoseph Zammit, MD 08/19/15 [phone]

## 2015-08-17 NOTE — Discharge Instructions (Signed)
Read the information below.  Use the prescribed medication as directed.  Please discuss all new medications with your pharmacist.  Do not take additional tylenol while taking the prescribed pain medication to avoid overdose.  You may return to the Emergency Department at any time for worsening condition or any new symptoms that concern you.   Please call the dentist listed above within 48 hours to schedule a close follow up appointment.  If you develop fevers, increased swelling in your face, difficulty swallowing or breathing, return to the ER immediately for a recheck.     Dental Abscess A dental abscess is a collection of pus in or around a tooth. CAUSES This condition is caused by a bacterial infection around the root of the tooth that involves the inner part of the tooth (pulp). It may result from:  Severe tooth decay.  Trauma to the tooth that allows bacteria to enter into the pulp, such as a broken or chipped tooth.  Severe gum disease around a tooth. SYMPTOMS Symptoms of this condition include:  Severe pain in and around the infected tooth.  Swelling and redness around the infected tooth, in the mouth, or in the face.  Tenderness.  Pus drainage.  Bad breath.  Bitter taste in the mouth.  Difficulty swallowing.  Difficulty opening the mouth.  Nausea.  Vomiting.  Chills.  Swollen neck glands.  Fever. DIAGNOSIS This condition is diagnosed with examination of the infected tooth. During the exam, your dentist may tap on the infected tooth. Your dentist will also ask about your medical and dental history and may order X-rays. TREATMENT This condition is treated by eliminating the infection. This may be done with:  Antibiotic medicine.  A root canal. This may be performed to save the tooth.  Pulling (extracting) the tooth. This may also involve draining the abscess. This is done if the tooth cannot be saved. HOME CARE INSTRUCTIONS  Take medicines only as directed  by your dentist.  If you were prescribed antibiotic medicine, finish all of it even if you start to feel better.  Rinse your mouth (gargle) often with salt water to relieve pain or swelling.  Do not drive or operate heavy machinery while taking pain medicine.  Do not apply heat to the outside of your mouth.  Keep all follow-up visits as directed by your dentist. This is important. SEEK MEDICAL CARE IF:  Your pain is worse and is not helped by medicine. SEEK IMMEDIATE MEDICAL CARE IF:  You have a fever or chills.  Your symptoms suddenly get worse.  You have a very bad headache.  You have problems breathing or swallowing.  You have trouble opening your mouth.  You have swelling in your neck or around your eye.   This information is not intended to replace advice given to you by your health care provider. Make sure you discuss any questions you have with your health care provider.   Document Released: 02/11/2005 Document Revised: 06/28/2014 Document Reviewed: 02/08/2014 Elsevier Interactive Patient Education 2016 [employer]   [employer] Resource Guide Dental The United Ways 211 is a great source of information about community services available.  Access by dialing 2-1-1 from anywhere in [place], or by website -  PooledIncome.plwww.nc211.org.   Other Local Resources (Updated 02/2015)  Dental  Care   Services    Phone Number and Address  Cost  [facility] For children 210 - 42 years of age:   Cleaning  Tooth brushing/flossing  instruction  Sealants, fillings, crowns  Extractions  Emergency treatment  [phone] [address] [place], [place] [address] Charges based on family income.  Medicaid and some insurance plans accepted.     [facility], fillings, crowns  Extractions  Emergency treatment [phone] [place], [place]  Pregnant  women 53 years of age or older with a Medicaid card  [facility] [place]  Cleaning  Sealants, fillings, crowns  Extractions  Emergency treatment [phone] [address] [place], [place] Pregnant women 52 years of age or older with a Medicaid card  [facility] For children 67 - 66 years of age:   Cleaning  Tooth brushing/flossing instruction  Sealants, fillings, crowns  Extractions  Emergency treatment Limited orthodontic services for patients with Medicaid [phone] [address] [place], [place] [address] Medicaid and [facility] Health Choice cover for children up to age 76 and pregnant women.  Parents of children up to age 42 without Medicaid pay a reduced fee at time of service.  [facility] of [employer] For children 49 - 78 years of age:   Cleaning  Tooth brushing/flossing instruction  Sealants, fillings, crowns  Extractions  Emergency treatment Limited orthodontic services for patients with Medicaid [phone] [address] [place], [place].  Medicaid and [place] Health Choice cover for children up to age 28 and pregnant women.  Parents of children up to age 7 without Medicaid pay a reduced fee.  [facility]  Sealants, fillings, crowns  Extractions  Hours: Tuesdays and Thursdays, 4:15 - 8 pm [phone] [address] [place], [place] [address] Services free of charge to [facility] residents ages 18-64 who do not have health insurance, Medicare, [place], or [place] benefits and fall within federal poverty guidelines  [employer]    Provides dental care in addition to primary medical care, nutritional counseling, and pharmacy:  Nurse, mental health, fillings, crowns  Extractions                  [phone] [facility], [address] [place], [place]  [phone] [facility], [address] [place], [place]  [phone] [facility] [place], [place]  [phone] [facility], [address] [place], [place]  [phone] [facility] [address] [place], [place] Accepts [place], [place], most insurance.  Also provides services available to all with fees adjusted based on ability to pay.    [facility]  Cleaning  Tooth brushing/flossing instruction  Sealants, fillings, crowns  Extractions  Emergency treatment Hours: Tuesdays, Thursdays, and Fridays from 8 am to 5 pm by appointment only. [phone] [address] [place], [place] [address] [facility] residents with Medicaid (depending on eligibility) and children with [facility] Health Choice - call for more information.  [facility]  Extractions only  Hours: 2nd and 4th Thursday of each month from 6:30 am - 9 am.   [phone]. [phone] [place], [place] [address] Ages 20 and older only.  Patients are seen on a first come, first served basis.  [employer]  [employer]  Extractions  Orthodontics  Endodontics  Implants/Crowns/Bridges  Complete and partial dentures [phone] [place], [place] Patients  must complete an application for services.  There is often a waiting list.

## 2015-10-10 ENCOUNTER — Emergency Department (HOSPITAL_COMMUNITY)
Admission: EM | Admit: 2015-10-10 | Discharge: 2015-10-10 | Disposition: A | Discharge status: Home / Self Care | Payer: Self-pay | Attending: Emergency Medicine | Admitting: Emergency Medicine

## 2015-10-10 ENCOUNTER — Encounter (HOSPITAL_COMMUNITY): Payer: Self-pay | Admitting: Emergency Medicine

## 2015-10-10 ENCOUNTER — Emergency Department (HOSPITAL_COMMUNITY): Payer: Self-pay

## 2015-10-10 DIAGNOSIS — W208XXA Other cause of strike by thrown, projected or falling object, initial encounter: Secondary | ICD-10-CM | POA: Insufficient documentation

## 2015-10-10 DIAGNOSIS — I10 Essential (primary) hypertension: Secondary | ICD-10-CM | POA: Insufficient documentation

## 2015-10-10 DIAGNOSIS — F17210 Nicotine dependence, cigarettes, uncomplicated: Secondary | ICD-10-CM | POA: Insufficient documentation

## 2015-10-10 DIAGNOSIS — Y9269 Other specified industrial and construction area as the place of occurrence of the external cause: Secondary | ICD-10-CM | POA: Insufficient documentation

## 2015-10-10 DIAGNOSIS — S5002XA Contusion of left elbow, initial encounter: Secondary | ICD-10-CM | POA: Insufficient documentation

## 2015-10-10 DIAGNOSIS — Y990 Civilian activity done for income or pay: Secondary | ICD-10-CM | POA: Insufficient documentation

## 2015-10-10 DIAGNOSIS — Y9389 Activity, other specified: Secondary | ICD-10-CM | POA: Insufficient documentation

## 2015-10-10 HISTORY — DX: Peptic ulcer, site unspecified, unspecified as acute or chronic, without hemorrhage or perforation: K27.9

## 2015-10-10 MED ORDER — TRAMADOL HCL 50 MG PO TABS
50.0000 mg | ORAL_TABLET | Freq: Once | ORAL | Status: AC
  Administered 2015-10-10: 50 mg via ORAL
  Filled 2015-10-10: qty 1

## 2015-10-10 MED ORDER — TRAMADOL HCL 50 MG PO TABS: 50.0000 mg | ORAL_TABLET | Freq: Four times a day (QID) | ORAL | 0 refills | Status: DC | PRN

## 2015-10-10 MED ORDER — KETOROLAC TROMETHAMINE 60 MG/2ML IM SOLN
60.0000 mg | Freq: Once | INTRAMUSCULAR | Status: DC
  Filled 2015-10-10: qty 2

## 2015-10-10 NOTE — Discharge Instructions (Signed)
Ibuprofen 600 g every 6 hours as needed for pain.  Tramadol as prescribed as needed for pain not relieved with ibuprofen.  Rest.  Follow up with your primary Dr. if not improving in 1 week.

## 2015-10-10 NOTE — ED Triage Notes (Signed)
Pt. reports left elbow pain with mild swelling injured while at work today , pt. stated a piece of wood accidentally hit it while at work .

## 2015-10-10 NOTE — ED Provider Notes (Signed)
[facility]-EMERGENCY DEPT Provider Note   CSN: [id number] Arrival date & time: 10/10/15  0055     History   Chief Complaint Chief Complaint  Patient presents with  . Elbow Pain    HPI Corey Haynes is a 40 y.o. male.  Patient is a 40 year old male with no significant past medical history. He presents for evaluation of left elbow pain. 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 He reports pain and swelling to the olecranon since that time. He denies any numbness or tingling. His pain is worse with range of motion and palpation and relieved somewhat with rest. He reports no relief with Mobic or other over-the-counter medications.   The history is provided by the patient.    Past Medical History:  Diagnosis Date  . Hypertension   . PUD (peptic ulcer disease)     There are no active problems to display for this patient.   Past Surgical History:  Procedure Laterality Date  . CORNEAL TRANSPLANT         Home Medications    Prior to Admission medications   Medication Sig Start Date End Date Taking? Authorizing Provider  clindamycin (CLEOCIN) 300 MG capsule Take 1 capsule (300 mg total) by mouth 4 (four) times daily. X 10 days 08/17/15   Trixie DredgeEmily West, PA-C  ibuprofen (ADVIL,MOTRIN) 800 MG tablet Take 1 tablet (800 mg total) by mouth every 8 (eight) hours as needed for mild pain or moderate pain. 08/17/15   Trixie DredgeEmily West, PA-C  naproxen (NAPROSYN) 500 MG tablet Take 1 tablet (500 mg total) by mouth 2 (two) times daily. 08/03/15   Felicie Mornavid Smith, NP  oxyCODONE-acetaminophen (PERCOCET/ROXICET) 5-325 MG tablet Take 1-2 tablets by mouth every 4 (four) hours as needed for severe pain. 08/17/15   Trixie DredgeEmily West, PA-C    Family History No family history on file.  Social History Social History  Substance Use Topics  . Smoking status: Current Every Day Smoker    Types: Cigarettes  . Smokeless tobacco: Not on file  . Alcohol use Yes      Allergies   Aspirin and Shellfish allergy   Review of Systems Review of Systems  All other systems reviewed and are negative.    Physical Exam Updated Vital Signs BP 135/100 (BP Location: Right Arm)   Pulse 92   Temp 98.3 F (36.8 C) (Oral)   Resp 16   Ht 5\' 9"  (1.753 m)   Wt 190 lb (86.2 kg)   SpO2 97%   BMI 28.06 kg/m   Physical Exam  Constitutional: He is oriented to person, place, and time. He appears well-developed and well-nourished. No distress.  HENT:  Head: Normocephalic and atraumatic.  Neck: Normal range of motion. Neck supple.  Musculoskeletal:  The left elbow appears grossly normal. There is no significant swelling. There is tenderness to palpation over the olecranon, however no crepitus, redness, or other abnormality. He has pain with range of motion. Distal ulnar and radial pulses are easily palpable. He is able to flex and extend all fingers and sensation is intact throughout the entire hand.  Neurological: He is alert and oriented to person, place, and time.  Skin: Skin is warm and dry. He is not diaphoretic.  Nursing note and vitals reviewed.    ED Treatments / Results  Labs (all labs ordered are listed, but only abnormal results are displayed) Labs Reviewed - No data to display  EKG  EKG Interpretation None       Radiology Dg Elbow Complete Left  Result Date: 10/10/2015 CLINICAL DATA:  Pain and swelling around the left elbow after struck by a piece of wood. EXAM: LEFT ELBOW - COMPLETE 3+ VIEW COMPARISON:  None. FINDINGS: There is no evidence of fracture, dislocation, or joint effusion. There is no evidence of arthropathy or other focal bone abnormality. Soft tissues are unremarkable. No radiopaque soft tissue foreign bodies identified. IMPRESSION: Negative. Electronically Signed   By: Burman NievesWilliam  Stevens M.D.   On: 10/10/2015 02:02    Procedures Procedures (including critical care time)  Medications Ordered in ED Medications   ketorolac (TORADOL) injection 60 mg (not administered)     Initial Impression / Assessment and Plan / ED Course  I have reviewed the triage vital signs and the nursing notes.  Pertinent labs & imaging results that were available during my care of the patient were reviewed by me and considered in my medical decision making (see chart for details).  Clinical Course    X-rays are negative for fracture. He will be given IM Toradol and discharged with tramadol.  Final Clinical Impressions(s) / ED Diagnoses   Final diagnoses:  None    New Prescriptions New Prescriptions   No medications on file     Geoffery Lyonsouglas Raniya Golembeski, MD 10/10/15 0425

## 2015-10-10 NOTE — ED Notes (Signed)
Pt left at this time with all belongings.  

## 2016-12-19 ENCOUNTER — Emergency Department (HOSPITAL_COMMUNITY)
Admission: EM | Admit: 2016-12-19 | Discharge: 2016-12-20 | Disposition: A | Discharge status: Home / Self Care | Payer: Self-pay | Attending: Emergency Medicine | Admitting: Emergency Medicine

## 2016-12-19 ENCOUNTER — Encounter (HOSPITAL_COMMUNITY): Payer: Self-pay | Admitting: *Deleted

## 2016-12-19 DIAGNOSIS — F17210 Nicotine dependence, cigarettes, uncomplicated: Secondary | ICD-10-CM | POA: Insufficient documentation

## 2016-12-19 DIAGNOSIS — Z79899 Other long term (current) drug therapy: Secondary | ICD-10-CM | POA: Insufficient documentation

## 2016-12-19 DIAGNOSIS — Z202 Contact with and (suspected) exposure to infections with a predominantly sexual mode of transmission: Secondary | ICD-10-CM | POA: Insufficient documentation

## 2016-12-19 DIAGNOSIS — M79604 Pain in right leg: Secondary | ICD-10-CM | POA: Insufficient documentation

## 2016-12-19 DIAGNOSIS — I10 Essential (primary) hypertension: Secondary | ICD-10-CM | POA: Insufficient documentation
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
## 2016-12-20 LAB — URINALYSIS, ROUTINE W REFLEX MICROSCOPIC
Bilirubin Urine: NEGATIVE
Glucose, UA: NEGATIVE mg/dL
HGB URINE DIPSTICK: NEGATIVE
Ketones, ur: NEGATIVE mg/dL
Leukocytes, UA: NEGATIVE
Nitrite: NEGATIVE
PH: 5 (ref 5.0–8.0)
Protein, ur: NEGATIVE mg/dL
Specific Gravity, Urine: 1.02 (ref 1.005–1.030)

## 2016-12-20 LAB — GC/CHLAMYDIA PROBE AMP (~~LOC~~) NOT AT ARMC
CHLAMYDIA, DNA PROBE: NEGATIVE
Neisseria Gonorrhea: NEGATIVE

## 2016-12-20 MED ORDER — HYDROCODONE-ACETAMINOPHEN 5-325 MG PO TABS
1.0000 | ORAL_TABLET | Freq: Once | ORAL | Status: AC
  Administered 2016-12-20: 1 via ORAL
  Filled 2016-12-20: qty 1

## 2016-12-20 MED ORDER — MELOXICAM 7.5 MG PO TABS
15.0000 mg | ORAL_TABLET | Freq: Every day | ORAL | 0 refills | Status: DC
Start: 2016-12-20 — End: 2017-03-24

## 2016-12-20 MED ORDER — HYDROCODONE-ACETAMINOPHEN 5-325 MG PO TABS: 1.0000 | ORAL_TABLET | Freq: Three times a day (TID) | ORAL | 0 refills | Status: DC | PRN

## 2016-12-20 NOTE — Discharge Instructions (Signed)
We advise use of Mobic daily. Do NOT take this with Aleve, Aspirin, BC powders, Motrin, ibuprofen, or Naproxen. We also recommend ice to areas of pain to limit swelling. Try to use orthotics in your shoes for support. These can be purchased at a local pharmacy or [address]. You may take Norco as needed for severe pain. Follow up with a primary care doctor.
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
## 2017-03-24 ENCOUNTER — Other Ambulatory Visit: Payer: Self-pay

## 2017-03-24 ENCOUNTER — Encounter (HOSPITAL_COMMUNITY): Payer: Self-pay

## 2017-03-24 ENCOUNTER — Emergency Department (HOSPITAL_COMMUNITY): Payer: Self-pay

## 2017-03-24 ENCOUNTER — Emergency Department (HOSPITAL_COMMUNITY)
Admission: EM | Admit: 2017-03-24 | Discharge: 2017-03-24 | Disposition: A | Discharge status: Home / Self Care | Payer: Self-pay | Attending: Emergency Medicine | Admitting: Emergency Medicine

## 2017-03-24 DIAGNOSIS — Z79899 Other long term (current) drug therapy: Secondary | ICD-10-CM | POA: Insufficient documentation

## 2017-03-24 DIAGNOSIS — R1084 Generalized abdominal pain: Secondary | ICD-10-CM | POA: Insufficient documentation

## 2017-03-24 DIAGNOSIS — F17210 Nicotine dependence, cigarettes, uncomplicated: Secondary | ICD-10-CM | POA: Insufficient documentation

## 2017-03-24 DIAGNOSIS — I10 Essential (primary) hypertension: Secondary | ICD-10-CM | POA: Insufficient documentation

## 2017-03-24 LAB — RAPID URINE DRUG SCREEN, HOSP PERFORMED
Amphetamines: NOT DETECTED
Barbiturates: NOT DETECTED
Benzodiazepines: NOT DETECTED
COCAINE: POSITIVE — AB
Opiates: POSITIVE — AB
TETRAHYDROCANNABINOL: POSITIVE — AB

## 2017-03-24 LAB — COMPREHENSIVE METABOLIC PANEL
ALBUMIN: 3.6 g/dL (ref 3.5–5.0)
ALK PHOS: 77 U/L (ref 38–126)
ALT: 21 U/L (ref 17–63)
AST: 14 U/L — AB (ref 15–41)
Anion gap: 10 (ref 5–15)
BILIRUBIN TOTAL: 0.3 mg/dL (ref 0.3–1.2)
BUN: 13 mg/dL (ref 6–20)
CALCIUM: 8.7 mg/dL — AB (ref 8.9–10.3)
CO2: 25 mmol/L (ref 22–32)
CREATININE: 1.26 mg/dL — AB (ref 0.61–1.24)
Chloride: 104 mmol/L (ref 101–111)
GFR calc Af Amer: >60 mL/min (ref >60)
GFR calc non Af Amer: >60 mL/min (ref >60)
GLUCOSE: 111 mg/dL — AB (ref 65–99)
Potassium: 3.9 mmol/L (ref 3.5–5.1)
Sodium: 139 mmol/L (ref 135–145)
Total Protein: 6.1 g/dL — ABNORMAL LOW (ref 6.5–8.1)

## 2017-03-24 LAB — CBC
HCT: 40.2 % (ref 39.0–52.0)
Hemoglobin: 13.4 g/dL (ref 13.0–17.0)
MCH: 29.8 pg (ref 26.0–34.0)
MCHC: 33.3 g/dL (ref 30.0–36.0)
MCV: 89.3 fL (ref 78.0–100.0)
Platelets: 367 10*3/uL (ref 150–400)
RBC: 4.5 MIL/uL (ref 4.22–5.81)
RDW: 14.5 % (ref 11.5–15.5)
WBC: 12.3 10*3/uL — ABNORMAL HIGH (ref 4.0–10.5)

## 2017-03-24 LAB — URINALYSIS, ROUTINE W REFLEX MICROSCOPIC
BILIRUBIN URINE: NEGATIVE
Glucose, UA: NEGATIVE mg/dL
HGB URINE DIPSTICK: NEGATIVE
KETONES UR: NEGATIVE mg/dL
Leukocytes, UA: NEGATIVE
NITRITE: NEGATIVE
Protein, ur: NEGATIVE mg/dL
SPECIFIC GRAVITY, URINE: 1.031 — AB (ref 1.005–1.030)
pH: 5 (ref 5.0–8.0)

## 2017-03-24 LAB — LIPASE, BLOOD: Lipase: 38 U/L (ref 11–51)

## 2017-03-24 MED ORDER — FAMOTIDINE 20 MG PO TABS: 20.0000 mg | ORAL_TABLET | Freq: Two times a day (BID) | ORAL | 0 refills | Status: DC

## 2017-03-24 MED ORDER — ONDANSETRON HCL 4 MG/2ML IJ SOLN
4.0000 mg | Freq: Once | INTRAMUSCULAR | Status: AC
  Administered 2017-03-24: 4 mg via INTRAVENOUS
  Filled 2017-03-24: qty 2

## 2017-03-24 MED ORDER — FAMOTIDINE IN NACL 20-0.9 MG/50ML-% IV SOLN
20.0000 mg | Freq: Once | INTRAVENOUS | Status: AC
  Administered 2017-03-24: 20 mg via INTRAVENOUS
  Filled 2017-03-24: qty 50

## 2017-03-24 MED ORDER — GI COCKTAIL ~~LOC~~
30.0000 mL | Freq: Once | ORAL | Status: AC
  Administered 2017-03-24: 30 mL via ORAL
  Filled 2017-03-24: qty 30

## 2017-03-24 MED ORDER — METRONIDAZOLE 500 MG PO TABS
500.0000 mg | ORAL_TABLET | Freq: Two times a day (BID) | ORAL | 0 refills | Status: DC
Start: 2017-03-24 — End: 2017-11-16

## 2017-03-24 MED ORDER — IOPAMIDOL (ISOVUE-300) INJECTION 61%
INTRAVENOUS | Status: AC
  Administered 2017-03-24: 100 mL
  Filled 2017-03-24: qty 100

## 2017-03-24 MED ORDER — MORPHINE SULFATE (PF) 4 MG/ML IV SOLN
2.0000 mg | Freq: Once | INTRAVENOUS | Status: AC
  Administered 2017-03-24: 2 mg via INTRAVENOUS
  Filled 2017-03-24: qty 1

## 2017-03-24 NOTE — ED Notes (Signed)
Patient stated refused POC occult blood with Doctor.

## 2017-03-24 NOTE — ED Notes (Signed)
Got patient on the monitor patient is resting with family at bedside gave patient a warm blanket call bell in reach patient is resting
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
## 2017-03-24 NOTE — ED Triage Notes (Signed)
Patient complains of generalized abdominal pain with burning x 3-4 days. States that he has had peptic ulcer before and thinks he has same again, NAD

## 2017-08-30 IMAGING — CR DG SHOULDER 2+V*R*
2 series · 2 of 2 positions shown · non-contrast
Comparison: None.

CLINICAL DATA: Fall from ladder yesterday with persistent right
shoulder pain, initial encounter

EXAM:
RIGHT SHOULDER - 2+ VIEW

[shoulder grashey]
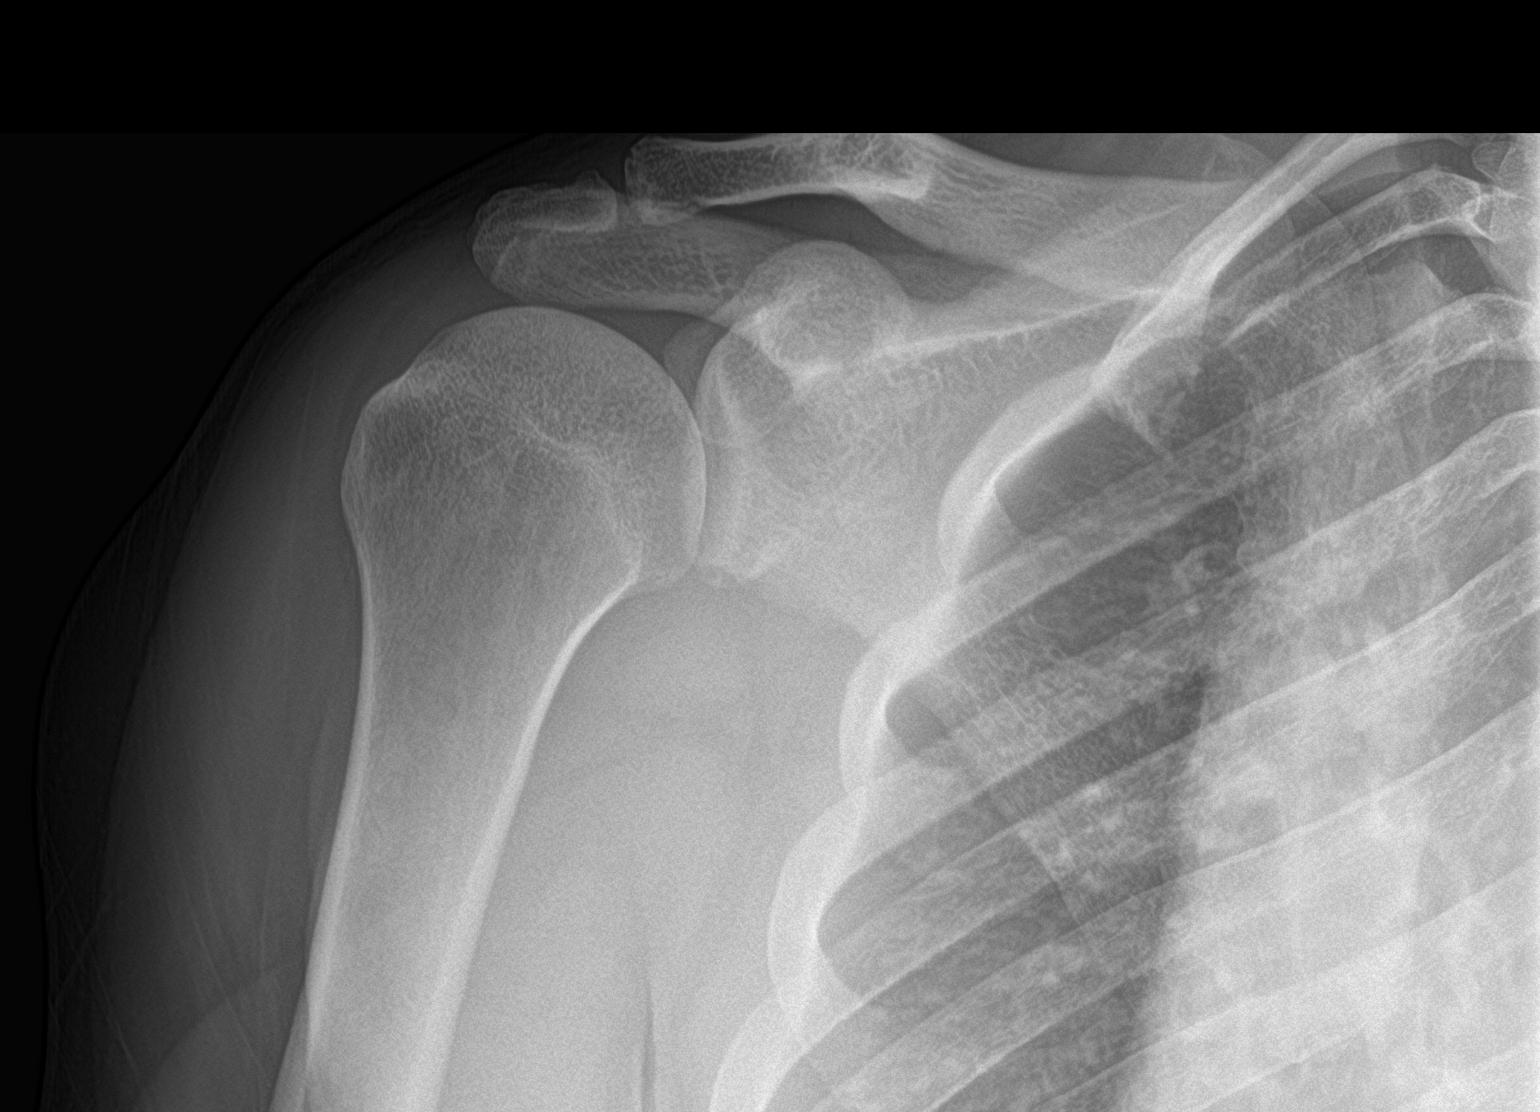

[shoulder y view]
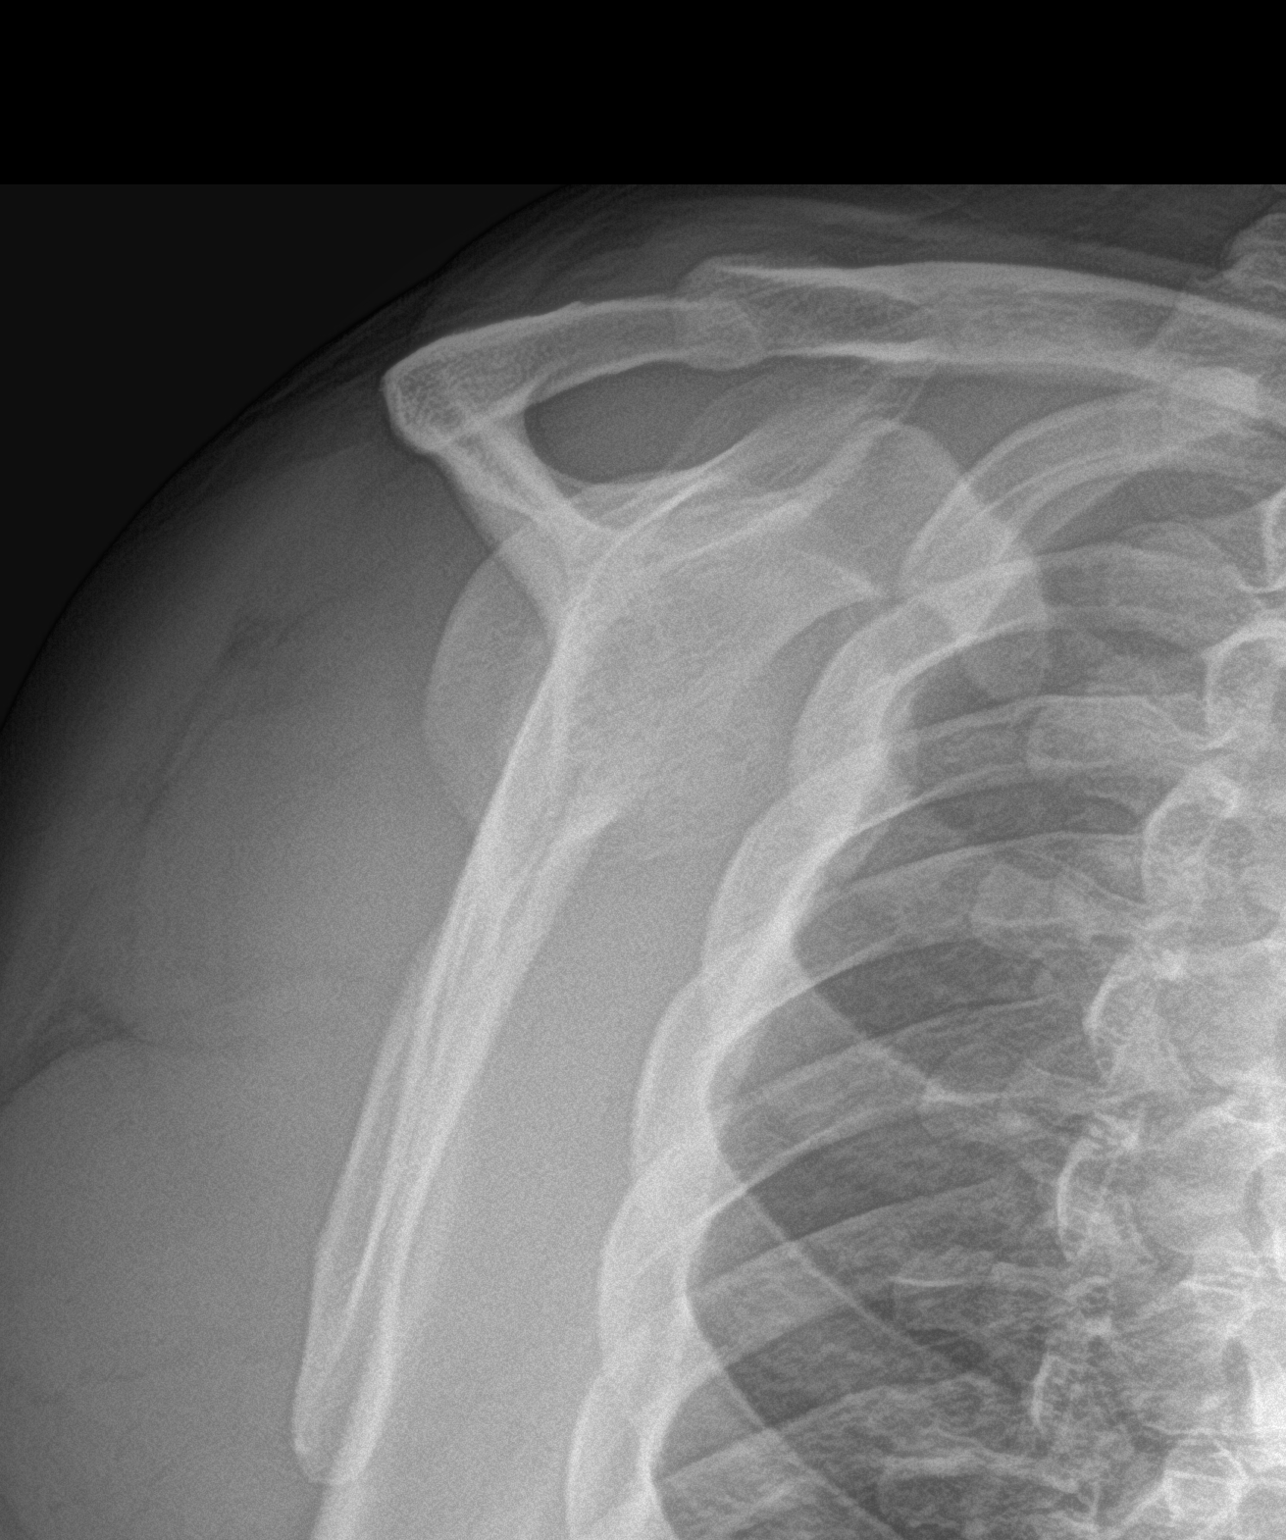

[2 of 2 positions shown; findings below may reference images not displayed]

FINDINGS: There is no evidence of fracture or dislocation. There is no
evidence of arthropathy or other focal bone abnormality. Soft
tissues are unremarkable.
IMPRESSION: No acute abnormality noted.

## 2017-11-16 ENCOUNTER — Encounter (HOSPITAL_COMMUNITY): Payer: Self-pay | Admitting: Emergency Medicine

## 2017-11-16 ENCOUNTER — Emergency Department (HOSPITAL_COMMUNITY)
Admission: EM | Admit: 2017-11-16 | Discharge: 2017-11-16 | Disposition: A | Discharge status: Home / Self Care | Payer: Self-pay | Attending: Emergency Medicine | Admitting: Emergency Medicine

## 2017-11-16 DIAGNOSIS — N289 Disorder of kidney and ureter, unspecified: Secondary | ICD-10-CM | POA: Insufficient documentation

## 2017-11-16 DIAGNOSIS — Z79899 Other long term (current) drug therapy: Secondary | ICD-10-CM | POA: Insufficient documentation

## 2017-11-16 DIAGNOSIS — F17210 Nicotine dependence, cigarettes, uncomplicated: Secondary | ICD-10-CM | POA: Insufficient documentation

## 2017-11-16 DIAGNOSIS — I10 Essential (primary) hypertension: Secondary | ICD-10-CM | POA: Insufficient documentation

## 2017-11-16 DIAGNOSIS — T40601A Poisoning by unspecified narcotics, accidental (unintentional), initial encounter: Secondary | ICD-10-CM | POA: Insufficient documentation

## 2017-11-16 DIAGNOSIS — M79662 Pain in left lower leg: Secondary | ICD-10-CM | POA: Insufficient documentation

## 2017-11-16 LAB — COMPREHENSIVE METABOLIC PANEL
ALBUMIN: 3.9 g/dL (ref 3.5–5.0)
ALT: 20 U/L (ref 0–44)
AST: 21 U/L (ref 15–41)
Alkaline Phosphatase: 79 U/L (ref 38–126)
Anion gap: 12 (ref 5–15)
BILIRUBIN TOTAL: 0.6 mg/dL (ref 0.3–1.2)
BUN: 11 mg/dL (ref 6–20)
CO2: 27 mmol/L (ref 22–32)
CREATININE: 1.54 mg/dL — AB (ref 0.61–1.24)
Calcium: 8.8 mg/dL — ABNORMAL LOW (ref 8.9–10.3)
Chloride: 103 mmol/L (ref 98–111)
GFR calc Af Amer: >60 mL/min (ref >60)
GFR, EST NON AFRICAN AMERICAN: 53 mL/min — AB (ref >60)
GLUCOSE: 173 mg/dL — AB (ref 70–99)
Potassium: 3.8 mmol/L (ref 3.5–5.1)
Sodium: 142 mmol/L (ref 135–145)
TOTAL PROTEIN: 6.5 g/dL (ref 6.5–8.1)

## 2017-11-16 LAB — CBC WITH DIFFERENTIAL/PLATELET
Abs Immature Granulocytes: 0.2 10*3/uL — ABNORMAL HIGH (ref 0.0–0.1)
BASOS ABS: 0.1 10*3/uL (ref 0.0–0.1)
BASOS PCT: 1 %
EOS ABS: 0.2 10*3/uL (ref 0.0–0.7)
EOS PCT: 1 %
HCT: 42.2 % (ref 39.0–52.0)
Hemoglobin: 13.4 g/dL (ref 13.0–17.0)
IMMATURE GRANULOCYTES: 1 %
LYMPHS PCT: 13 %
Lymphs Abs: 3.2 10*3/uL (ref 0.7–4.0)
MCH: 29.2 pg (ref 26.0–34.0)
MCHC: 31.8 g/dL (ref 30.0–36.0)
MCV: 91.9 fL (ref 78.0–100.0)
Monocytes Absolute: 1.3 10*3/uL — ABNORMAL HIGH (ref 0.1–1.0)
Monocytes Relative: 6 %
NEUTROS ABS: 18.9 10*3/uL — AB (ref 1.7–7.7)
Neutrophils Relative %: 78 %
PLATELETS: 376 10*3/uL (ref 150–400)
RBC: 4.59 MIL/uL (ref 4.22–5.81)
RDW: 13.9 % (ref 11.5–15.5)
WBC: 23.8 10*3/uL — ABNORMAL HIGH (ref 4.0–10.5)

## 2017-11-16 LAB — ETHANOL

## 2017-11-16 LAB — RAPID URINE DRUG SCREEN, HOSP PERFORMED
Amphetamines: NOT DETECTED
BENZODIAZEPINES: NOT DETECTED
Barbiturates: NOT DETECTED
COCAINE: POSITIVE — AB
Opiates: POSITIVE — AB
Tetrahydrocannabinol: NOT DETECTED

## 2017-11-16 MED ORDER — NALOXONE HCL 4 MG/0.1ML NA LIQD: NASAL | 0 refills | Status: AC

## 2017-11-16 NOTE — ED Provider Notes (Signed)
[facility] EMERGENCY DEPARTMENT Provider Note   CSN: [id number] Arrival date & time: 11/16/17  0458     History   Chief Complaint Chief Complaint  Patient presents with  . Drug Overdose    HPI Corey Haynes is a 42 y.o. male.  The history is provided by the patient and the EMS personnel. The history is limited by the condition of the patient (Patient uncooperative).  He has history of hypertension and peptic ulcer disease and was brought in by EMS after an apparent external overdose.  He states that he was snorting, not injecting.  He is very uncooperative and will not tell me what it was that he snorted.  He does deny other drug use and denies alcohol use.  He denies suicidal intent.  EMS gave naloxone on the scene.  Past Medical History:  Diagnosis Date  . Hypertension   . PUD (peptic ulcer disease)     There are no active problems to display for this patient.   Past Surgical History:  Procedure Laterality Date  . CORNEAL TRANSPLANT          Home Medications    Prior to Admission medications   Medication Sig Start Date End Date Taking? Authorizing Provider  alum & mag hydroxide-simeth (MAALOX/MYLANTA) 200-200-20 MG/5ML suspension Take 30 mLs by mouth as needed for indigestion or heartburn.    [provider]  bismuth subsalicylate (PEPTO BISMOL) 262 MG/15ML suspension Take 30 mLs by mouth as needed for indigestion.    [provider]  famotidine (PEPCID) 20 MG tablet Take 1 tablet (20 mg total) by mouth 2 (two) times daily. 03/24/17   Wynetta Fines, MD  HYDROcodone-acetaminophen (NORCO/VICODIN) 5-325 MG tablet Take 1 tablet by mouth every 8 (eight) hours as needed for severe pain. Patient not taking: Reported on 03/24/2017 12/20/16   Antony Madura, PA-C  metroNIDAZOLE (FLAGYL) 500 MG tablet Take 1 tablet (500 mg total) by mouth 2 (two) times daily. 03/24/17   Wynetta Fines, MD    Family History No family history on  file.  Social History Social History   Tobacco Use  . Smoking status: Current Every Day Smoker    Types: Cigarettes  . Smokeless tobacco: Never Used  Substance Use Topics  . Alcohol use: Yes  . Drug use: Yes    Types: Marijuana    Comment: quit x 2 months ago     Allergies   Aspirin and Shellfish allergy   Review of Systems Review of Systems  Unable to perform ROS: Other (Uncooperative)     Physical Exam Updated Vital Signs BP (!) 130/97   Pulse (!) 102   Temp 98.7 F (37.1 C) (Oral)   Resp 15   SpO2 94%   Physical Exam  Nursing note and vitals reviewed.  42 year old male, resting comfortably and in no acute distress. Vital signs are significant for borderline elevated heart rate and mild elevation of diastolic blood pressure. Oxygen saturation is 94%, which is normal. Head is normocephalic and atraumatic. PERRLA, EOMI. Oropharynx is clear. Neck is nontender and supple without adenopathy or JVD. Back is nontender and there is no CVA tenderness. Lungs are clear without rales, wheezes, or rhonchi. Chest is nontender. Heart has regular rate and rhythm without murmur. Abdomen is soft, flat, nontender without masses or hepatosplenomegaly and peristalsis is normoactive. Extremities have no cyanosis or edema, full range of motion is present. Skin is warm and dry without rash. Neurologic: Mental status is  normal, cranial nerves are intact, there are no motor or sensory deficits.  ED Treatments / Results  Labs (all labs ordered are listed, but only abnormal results are displayed) Labs Reviewed  COMPREHENSIVE METABOLIC PANEL - Abnormal; Notable for the following components:      Result Value   Glucose, Bld 173 (*)    Creatinine, Ser 1.54 (*)    Calcium 8.8 (*)    GFR calc non Af Amer 53 (*)    All other components within normal limits  CBC WITH DIFFERENTIAL/PLATELET - Abnormal; Notable for the following components:   WBC 23.8 (*)    Neutro Abs 18.9 (*)     Monocytes Absolute 1.3 (*)    Abs Immature Granulocytes 0.2 (*)    All other components within normal limits  ETHANOL  RAPID URINE DRUG SCREEN, HOSP PERFORMED    Procedures Procedures   Medications Ordered in ED Medications - No data to display   Initial Impression / Assessment and Plan / ED Course  I have reviewed the triage vital signs and the nursing notes.  Pertinent lab results that were available during my care of the patient were reviewed by me and considered in my medical decision making (see chart for details).  Apparent opioid overdose.  Probably overdose of fentanyl, but I cannot be certain that he did not have heroin as part of the overdose.  He will need to be observed in the ED for 4hours.  Old records are reviewed, and he has no relevant past visits.  7:49 AM Laboratory work-up shows renal insufficiency which is slightly worse than baseline.  He has remained hemodynamically stable in the ED and maintaining good oxygen saturations on room air.  Case is signed out to Dr. Lynelle DoctorKnapp.  Final Clinical Impressions(s) / ED Diagnoses   Final diagnoses:  Opiate overdose, accidental or unintentional, initial encounter [facility](HCC)  Renal insufficiency    ED Discharge Orders    None       Dione BoozeGlick, Amarilis Belflower, MD 11/16/17 [phone]

## 2017-11-16 NOTE — ED Triage Notes (Signed)
Pt here from home with c/o OD on fentanyl pt received 2 mg narcan 1mg   IN and 1mg  IV , pt arrived alert and oriented
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
## 2023-07-27 DEATH — deceased
